# Patient Record
Sex: Male | Born: 1965 | Race: White | Hispanic: No | Marital: Single | Smoking: Never smoker
Health system: Southern US, Community
[De-identification: ages and names within clinical notes are randomized; demographics above are authoritative.]

## PROBLEM LIST (undated history)

## (undated) DIAGNOSIS — I10 Essential (primary) hypertension: Secondary | ICD-10-CM

## (undated) HISTORY — PX: HERNIA REPAIR: SHX51

---

## 1991-10-21 HISTORY — PX: ANTERIOR CRUCIATE LIGAMENT REPAIR: SHX115

## 2015-10-30 ENCOUNTER — Inpatient Hospital Stay (HOSPITAL_COMMUNITY)
Admission: EM | Admit: 2015-10-30 | Discharge: 2015-11-02 | DRG: 292 | Disposition: A | Discharge status: Home / Self Care | Payer: Self-pay | Attending: Student in an Organized Health Care Education/Training Program | Admitting: Student in an Organized Health Care Education/Training Program

## 2015-10-30 ENCOUNTER — Emergency Department (HOSPITAL_COMMUNITY): Payer: Self-pay

## 2015-10-30 ENCOUNTER — Inpatient Hospital Stay (HOSPITAL_COMMUNITY): Payer: MEDICAID

## 2015-10-30 ENCOUNTER — Encounter (HOSPITAL_COMMUNITY): Payer: Self-pay

## 2015-10-30 DIAGNOSIS — G621 Alcoholic polyneuropathy: Secondary | ICD-10-CM | POA: Diagnosis present

## 2015-10-30 DIAGNOSIS — F10239 Alcohol dependence with withdrawal, unspecified: Secondary | ICD-10-CM | POA: Diagnosis present

## 2015-10-30 DIAGNOSIS — R079 Chest pain, unspecified: Secondary | ICD-10-CM

## 2015-10-30 DIAGNOSIS — G4730 Sleep apnea, unspecified: Secondary | ICD-10-CM | POA: Diagnosis present

## 2015-10-30 DIAGNOSIS — J810 Acute pulmonary edema: Secondary | ICD-10-CM

## 2015-10-30 DIAGNOSIS — G629 Polyneuropathy, unspecified: Secondary | ICD-10-CM

## 2015-10-30 DIAGNOSIS — D6959 Other secondary thrombocytopenia: Secondary | ICD-10-CM | POA: Diagnosis present

## 2015-10-30 DIAGNOSIS — I5030 Unspecified diastolic (congestive) heart failure: Secondary | ICD-10-CM | POA: Insufficient documentation

## 2015-10-30 DIAGNOSIS — K7030 Alcoholic cirrhosis of liver without ascites: Secondary | ICD-10-CM | POA: Diagnosis present

## 2015-10-30 DIAGNOSIS — M25569 Pain in unspecified knee: Secondary | ICD-10-CM

## 2015-10-30 DIAGNOSIS — E669 Obesity, unspecified: Secondary | ICD-10-CM | POA: Diagnosis present

## 2015-10-30 DIAGNOSIS — F1020 Alcohol dependence, uncomplicated: Secondary | ICD-10-CM

## 2015-10-30 DIAGNOSIS — M170 Bilateral primary osteoarthritis of knee: Secondary | ICD-10-CM | POA: Diagnosis present

## 2015-10-30 DIAGNOSIS — R0789 Other chest pain: Secondary | ICD-10-CM

## 2015-10-30 DIAGNOSIS — E785 Hyperlipidemia, unspecified: Secondary | ICD-10-CM | POA: Diagnosis present

## 2015-10-30 DIAGNOSIS — D696 Thrombocytopenia, unspecified: Secondary | ICD-10-CM

## 2015-10-30 DIAGNOSIS — T502X5A Adverse effect of carbonic-anhydrase inhibitors, benzothiadiazides and other diuretics, initial encounter: Secondary | ICD-10-CM | POA: Diagnosis not present

## 2015-10-30 DIAGNOSIS — I110 Hypertensive heart disease with heart failure: Principal | ICD-10-CM | POA: Diagnosis present

## 2015-10-30 DIAGNOSIS — Z9114 Patient's other noncompliance with medication regimen: Secondary | ICD-10-CM

## 2015-10-30 DIAGNOSIS — I426 Alcoholic cardiomyopathy: Secondary | ICD-10-CM | POA: Diagnosis present

## 2015-10-30 DIAGNOSIS — E876 Hypokalemia: Secondary | ICD-10-CM | POA: Diagnosis not present

## 2015-10-30 DIAGNOSIS — Z6838 Body mass index (BMI) 38.0-38.9, adult: Secondary | ICD-10-CM

## 2015-10-30 DIAGNOSIS — I10 Essential (primary) hypertension: Secondary | ICD-10-CM

## 2015-10-30 DIAGNOSIS — Z825 Family history of asthma and other chronic lower respiratory diseases: Secondary | ICD-10-CM

## 2015-10-30 DIAGNOSIS — K760 Fatty (change of) liver, not elsewhere classified: Secondary | ICD-10-CM | POA: Diagnosis present

## 2015-10-30 DIAGNOSIS — Z77098 Contact with and (suspected) exposure to other hazardous, chiefly nonmedicinal, chemicals: Secondary | ICD-10-CM | POA: Diagnosis present

## 2015-10-30 DIAGNOSIS — I5031 Acute diastolic (congestive) heart failure: Secondary | ICD-10-CM | POA: Diagnosis present

## 2015-10-30 DIAGNOSIS — R0602 Shortness of breath: Secondary | ICD-10-CM

## 2015-10-30 DIAGNOSIS — Z820 Family history of epilepsy and other diseases of the nervous system: Secondary | ICD-10-CM

## 2015-10-30 HISTORY — DX: Essential (primary) hypertension: I10

## 2015-10-30 LAB — CBC WITH DIFFERENTIAL/PLATELET
Basophils Absolute: 0.1 10*3/uL (ref 0.0–0.1)
Basophils Relative: 1 %
EOS ABS: 0 10*3/uL (ref 0.0–0.7)
EOS PCT: 1 %
HCT: 38.6 % — ABNORMAL LOW (ref 39.0–52.0)
Hemoglobin: 13.2 g/dL (ref 13.0–17.0)
LYMPHS ABS: 1.7 10*3/uL (ref 0.7–4.0)
Lymphocytes Relative: 30 %
MCH: 31.4 pg (ref 26.0–34.0)
MCHC: 34.2 g/dL (ref 30.0–36.0)
MCV: 91.7 fL (ref 78.0–100.0)
MONO ABS: 0.7 10*3/uL (ref 0.1–1.0)
Monocytes Relative: 13 %
Neutro Abs: 3.2 10*3/uL (ref 1.7–7.7)
Neutrophils Relative %: 55 %
PLATELETS: 72 10*3/uL — AB (ref 150–400)
RBC: 4.21 MIL/uL — AB (ref 4.22–5.81)
RDW: 13.3 % (ref 11.5–15.5)
WBC: 5.7 10*3/uL (ref 4.0–10.5)

## 2015-10-30 LAB — COMPREHENSIVE METABOLIC PANEL
ALT: 30 U/L (ref 17–63)
AST: 99 U/L — ABNORMAL HIGH (ref 15–41)
Albumin: 3.1 g/dL — ABNORMAL LOW (ref 3.5–5.0)
Alkaline Phosphatase: 64 U/L (ref 38–126)
Anion gap: 11 (ref 5–15)
BUN: 8 mg/dL (ref 6–20)
CHLORIDE: 107 mmol/L (ref 101–111)
CO2: 24 mmol/L (ref 22–32)
CREATININE: 0.64 mg/dL (ref 0.61–1.24)
Calcium: 8.6 mg/dL — ABNORMAL LOW (ref 8.9–10.3)
Glucose, Bld: 103 mg/dL — ABNORMAL HIGH (ref 65–99)
POTASSIUM: 5.1 mmol/L (ref 3.5–5.1)
SODIUM: 142 mmol/L (ref 135–145)
Total Bilirubin: 1.5 mg/dL — ABNORMAL HIGH (ref 0.3–1.2)
Total Protein: 6.6 g/dL (ref 6.5–8.1)

## 2015-10-30 LAB — I-STAT TROPONIN, ED
TROPONIN I, POC: 0.03 ng/mL (ref 0.00–0.08)
Troponin i, poc: 0.02 ng/mL (ref 0.00–0.08)

## 2015-10-30 LAB — BRAIN NATRIURETIC PEPTIDE: B NATRIURETIC PEPTIDE 5: 58.9 pg/mL (ref 0.0–100.0)

## 2015-10-30 LAB — MAGNESIUM: Magnesium: 1.6 mg/dL — ABNORMAL LOW (ref 1.7–2.4)

## 2015-10-30 LAB — TROPONIN I: TROPONIN I: 0.04 ng/mL — AB (ref <0.031)

## 2015-10-30 LAB — MRSA PCR SCREENING: MRSA by PCR: NEGATIVE

## 2015-10-30 MED ORDER — LORAZEPAM 2 MG/ML IJ SOLN
0.5000 mg | Freq: Four times a day (QID) | INTRAMUSCULAR | Status: DC | PRN
  Administered 2015-10-31: 0.5 mg via INTRAVENOUS
  Filled 2015-10-30: qty 1

## 2015-10-30 MED ORDER — MORPHINE SULFATE (PF) 4 MG/ML IV SOLN
4.0000 mg | Freq: Once | INTRAVENOUS | Status: AC
  Administered 2015-10-30: 4 mg via INTRAVENOUS
  Filled 2015-10-30: qty 1

## 2015-10-30 MED ORDER — PANTOPRAZOLE SODIUM 40 MG PO TBEC
40.0000 mg | DELAYED_RELEASE_TABLET | Freq: Every day | ORAL | Status: DC
  Administered 2015-10-30 – 2015-11-02 (×4): 40 mg via ORAL
  Filled 2015-10-30 (×4): qty 1

## 2015-10-30 MED ORDER — VITAMIN B-1 100 MG PO TABS
100.0000 mg | ORAL_TABLET | Freq: Every day | ORAL | Status: DC
  Administered 2015-10-30 – 2015-11-02 (×4): 100 mg via ORAL
  Filled 2015-10-30 (×4): qty 1

## 2015-10-30 MED ORDER — LORAZEPAM 1 MG PO TABS: 0.0000 mg | ORAL_TABLET | Freq: Four times a day (QID) | ORAL | Status: DC

## 2015-10-30 MED ORDER — LORAZEPAM 2 MG/ML IJ SOLN
0.5000 mg | INTRAMUSCULAR | Status: AC
  Administered 2015-10-30: 0.5 mg via INTRAVENOUS

## 2015-10-30 MED ORDER — DICLOFENAC SODIUM 1 % TD GEL
2.0000 g | Freq: Four times a day (QID) | TRANSDERMAL | Status: DC
  Administered 2015-10-30 – 2015-11-01 (×8): 2 g via TOPICAL
  Filled 2015-10-30: qty 100

## 2015-10-30 MED ORDER — FOLIC ACID 1 MG PO TABS
1.0000 mg | ORAL_TABLET | Freq: Every day | ORAL | Status: DC
  Administered 2015-10-30 – 2015-11-02 (×4): 1 mg via ORAL
  Filled 2015-10-30 (×4): qty 1

## 2015-10-30 MED ORDER — NITROGLYCERIN 0.4 MG SL SUBL
0.4000 mg | SUBLINGUAL_TABLET | SUBLINGUAL | Status: DC | PRN
  Administered 2015-10-30 (×3): 0.4 mg via SUBLINGUAL
  Filled 2015-10-30: qty 1

## 2015-10-30 MED ORDER — PROMETHAZINE HCL 25 MG/ML IJ SOLN
6.2500 mg | Freq: Four times a day (QID) | INTRAMUSCULAR | Status: DC | PRN
  Administered 2015-10-31: 6.25 mg via INTRAVENOUS
  Filled 2015-10-30: qty 1

## 2015-10-30 MED ORDER — LORAZEPAM 2 MG/ML IJ SOLN
1.0000 mg | Freq: Once | INTRAMUSCULAR | Status: AC
  Administered 2015-10-30: 1 mg via INTRAVENOUS
  Filled 2015-10-30: qty 1

## 2015-10-30 MED ORDER — ADULT MULTIVITAMIN W/MINERALS CH
1.0000 | ORAL_TABLET | Freq: Every day | ORAL | Status: DC
  Administered 2015-10-30 – 2015-11-02 (×4): 1 via ORAL
  Filled 2015-10-30 (×4): qty 1

## 2015-10-30 MED ORDER — LORAZEPAM 2 MG/ML IJ SOLN: 0.0000 mg | Freq: Four times a day (QID) | INTRAMUSCULAR | Status: DC

## 2015-10-30 MED ORDER — PROMETHAZINE HCL 25 MG PO TABS
12.5000 mg | ORAL_TABLET | Freq: Four times a day (QID) | ORAL | Status: DC | PRN
  Administered 2015-10-30 – 2015-10-31 (×2): 12.5 mg via ORAL
  Filled 2015-10-30 (×2): qty 1

## 2015-10-30 MED ORDER — THIAMINE HCL 100 MG/ML IJ SOLN: 100.0000 mg | Freq: Every day | INTRAMUSCULAR | Status: DC

## 2015-10-30 MED ORDER — IPRATROPIUM-ALBUTEROL 0.5-2.5 (3) MG/3ML IN SOLN
3.0000 mL | Freq: Four times a day (QID) | RESPIRATORY_TRACT | Status: DC
  Administered 2015-10-30 – 2015-10-31 (×3): 3 mL via RESPIRATORY_TRACT
  Filled 2015-10-30 (×3): qty 3

## 2015-10-30 MED ORDER — IOHEXOL 350 MG/ML SOLN
80.0000 mL | Freq: Once | INTRAVENOUS | Status: AC | PRN
  Administered 2015-10-30: 80 mL via INTRAVENOUS

## 2015-10-30 MED ORDER — MORPHINE SULFATE (PF) 2 MG/ML IV SOLN
2.0000 mg | Freq: Four times a day (QID) | INTRAVENOUS | Status: DC | PRN
  Administered 2015-10-30 – 2015-10-31 (×3): 2 mg via INTRAVENOUS
  Filled 2015-10-30 (×4): qty 1

## 2015-10-30 MED ORDER — FUROSEMIDE 10 MG/ML IJ SOLN
60.0000 mg | Freq: Once | INTRAMUSCULAR | Status: AC
  Administered 2015-10-30: 60 mg via INTRAVENOUS
  Filled 2015-10-30: qty 6

## 2015-10-30 MED ORDER — IPRATROPIUM-ALBUTEROL 0.5-2.5 (3) MG/3ML IN SOLN
3.0000 mL | Freq: Once | RESPIRATORY_TRACT | Status: AC
  Administered 2015-10-30: 3 mL via RESPIRATORY_TRACT
  Filled 2015-10-30: qty 3

## 2015-10-30 MED ORDER — LORAZEPAM 1 MG PO TABS: 0.0000 mg | ORAL_TABLET | Freq: Two times a day (BID) | ORAL | Status: DC

## 2015-10-30 MED ORDER — LORAZEPAM 2 MG/ML IJ SOLN
0.0000 mg | Freq: Two times a day (BID) | INTRAMUSCULAR | Status: DC
Start: 2015-10-30 — End: 2015-10-30
  Filled 2015-10-30: qty 1

## 2015-10-30 MED ORDER — MAGNESIUM SULFATE 2 GM/50ML IV SOLN
2.0000 g | Freq: Once | INTRAVENOUS | Status: AC
  Administered 2015-10-30: 2 g via INTRAVENOUS
  Filled 2015-10-30: qty 50

## 2015-10-30 MED ORDER — LORAZEPAM 0.5 MG PO TABS: 0.5000 mg | ORAL_TABLET | Freq: Four times a day (QID) | ORAL | Status: DC | PRN

## 2015-10-30 NOTE — ED Notes (Signed)
Called lab - will add-on d-dimer.

## 2015-10-30 NOTE — ED Notes (Signed)
Pain after 3rd nitro 3/10

## 2015-10-30 NOTE — ED Notes (Signed)
Called lab after cancellation and reordering of d-dimer to confirm EDP does want d-dimer run.

## 2015-10-30 NOTE — H&P (Signed)
Date: 10/30/2015               Patient Name:  Alexander Velasquez MRN: [id number]  DOB: 05-21-1966 Age / Sex: 50 y.o., male   PCP: No primary care provider on file.         Medical Service: Internal Medicine Teaching Service         Attending Physician: Dr. Azalia Bilis, MD    First Contact: Dr. Gwynn Burly Pager: [phone]  Second Contact: Dr. Rich Number  Pager: [phone]       After Hours (After 5p/  First Contact Pager: [phone]  weekends / holidays): Second Contact Pager: [phone]   Chief Complaint: Chest pain and SOB  History of Present Illness: [name] is a 50 y.o. man with past medical history of hypertension and alcohol abuse who presented to the emergency department because he feels that he cannot breath or catch his breath and he "feels like an elephant is sitting on my chest".  Onset of symptoms began yesterday after painting a car in the garage while wearing a respirator when he became short of breath and developed this chest pressure.  He has no prior history of similar symptoms.  Today, patient was at work when similar symptoms recurred and he felt like he was going to pass out.  However, patient never did lose consciousness.  He currently describes the pain he has as intermittently sharp, left-sided, with constant sensation of pressure and being unable to adequately take a deep breath due to restriction.  His shortness of breath is present at rest and worsened with exertion.  His pain is currently 4/10 but was as bad as 8/10 or so prior to receiving morphine.  Patient has no significant cardiac or past medical history because he does not regularly follow with a PCP.  He carries a diagnosis of HTN for which he takes Lisinopril and a "fluid pill" that he cannot recall the name of.  He received these medications after a 1 time visit with [facility], but has not followed up since.  Patient also takes a aspirin 81mg  daily for primary prevention.  Patient has not taken  fluid pill for past 3 weeks due to running out of medication.  During this time, he has noticed increased lower extremity edema, abdominal swelling.  He has stable 3-pillow orthopnea that has not changed.  Patient also with significant alcohol use as reported by both himself and his fiance.  He drinks a case of beer daily (24 beers) and has been doing so "for many years".  His last drink was this morning.  He has never withdrawn or had seizures from alcohol withdrawal.  He has apparently spent the past 2 weeks at a friends house where they had been drinking more so than usual.  Patient denies any other drug use but does report taking ibuprofen 200mg  about 10 times per day.  When asked how long he has been doing so, patient replied, "forever".  On review of systems, patient reports lower extremity swelling, chronic numbness and tingling in his hands and feet that has worsened in last 2 weeks, bleeding from his mouth (states he wakes up with blood on his pillow), cough.  He denies any nausea, vomiting, diarrhea, constipation, melena, abdominal pain, hematemesis, GERD.  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  He has one living child who is healthy.  Other child passed away at young age due to complications from being paraplegic.  He is not a smoker, denies drug use, alcohol use described above.    Patient has no past medical history of DM, MI, HLD, heart failure.  Family history significant for atrial fibrillation in mom and brother, COPD in mom.  Meds: Current Facility-Administered Medications  Medication Dose Route Frequency Provider Last Rate Last Dose  . LORazepam (ATIVAN) injection 0-4 mg  0-4 mg Intravenous 4 times per day Tatyana Kirichenko, PA-C      . LORazepam (ATIVAN) injection 0-4 mg  0-4 mg Intravenous Q12H Tatyana  Kirichenko, PA-C   0 mg at 10/30/15 1547  . LORazepam (ATIVAN) injection 0.5 mg  0.5 mg Intravenous STAT Ejiroghene E Emokpae, MD      . LORazepam (ATIVAN) tablet 0-4 mg  0-4 mg Oral 4 times per day Tatyana Kirichenko, PA-C      . LORazepam (ATIVAN) tablet 0-4 mg  0-4 mg Oral Q12H Tatyana Kirichenko, PA-C   0 mg at 10/30/15 1548  . nitroGLYCERIN (NITROSTAT) SL tablet 0.4 mg  0.4 mg Sublingual Q5 min PRN Tatyana Kirichenko, PA-C   0.4 mg at 10/30/15 1246  . thiamine (B-1) injection 100 mg  100 mg Intravenous Daily Tatyana Kirichenko, PA-C   100 mg at 10/30/15 1804  . thiamine (VITAMIN B-1) tablet 100 mg  100 mg Oral Daily Tatyana Kirichenko, PA-C   100 mg at 10/30/15 1755   Current Outpatient Prescriptions  Medication Sig Dispense Refill  . ibuprofen (ADVIL,MOTRIN) 200 MG tablet Take 200 mg by mouth every 6 (six) hours as needed for fever.    Marland Kitchen lisinopril (PRINIVIL,ZESTRIL) 20 MG tablet Take 20 mg by mouth daily.    Marland Kitchen tetrahydrozoline-zinc (VISINE-AC) 0.05-0.25 % ophthalmic solution Place 2 drops into both eyes 3 (three) times daily as needed.      Allergies: Allergies as of 10/30/2015 - Review Complete 10/30/2015  Allergen Reaction Noted  . Codeine Shortness Of Breath 10/30/2015   Past Medical History  Diagnosis Date  . Hypertension    No past surgical history on file. No family history on file. Social History   Social History  . Marital Status: Single    Spouse Name: N/A  . Number of Children: N/A  . Years of Education: N/A   Occupational History  . Not on file.   Social History Main Topics  . Smoking status: Never Smoker   . Smokeless tobacco: Not on file  . Alcohol Use: 27.0 oz/week    45 Cans of beer per week  . Drug Use: No  . Sexual Activity: Not on file   Other Topics Concern  . Not on file   Social History Narrative  . No narrative on file    Review of Systems: Pertinent items noted in HPI and remainder of comprehensive ROS otherwise negative.  Physical  Exam: Blood pressure 132/88, pulse 106, temperature 98.4 F (36.9 C), temperature source Oral, resp. rate 23, height 5\' 7"  (1.702 m), weight 245 lb (111.131 kg), SpO2 95 %. Physical Exam  Constitutional:  Obese male, tremulous, diaphoretic, sitting up in bed, mildly distressed  HENT:  Head: Normocephalic and atraumatic.  Eyes: EOM are normal.  Neck: Normal range of motion. Neck supple. No JVD (difficult to assess due to habitus) present.  Cardiovascular: Normal rate, regular rhythm and normal heart sounds.   Peripheral pulses intact, lower  extremities cool to the touch (which patient states is normal).  Very tender to palpation on chest wall at left side where states his sharp pain is located.  Pulmonary/Chest:  Effort normal, no wheezes, minimal fine crackles in the bases.  Patient became tremulous when trying to perform exam.  Abdominal: Soft. Bowel sounds are normal. He exhibits no distension. There is no tenderness.  Musculoskeletal:  1+ pitting edema bilaterally extending up to the knee  Neurological:  Alert and oriented, tremulous, no asterixis  Skin: Skin is warm and dry.  intact    Lab results: Basic Metabolic Panel:  Recent Labs  40/98/1100/08/05 1153  NA 142  K 5.1  CL 107  CO2 24  GLUCOSE 103*  BUN 8  CREATININE 0.64  CALCIUM 8.6*   Liver Function Tests:  Recent Labs  10/30/15 1153  AST 99*  ALT 30  ALKPHOS 64  BILITOT 1.5*  PROT 6.6  ALBUMIN 3.1*   No results for input(s): LIPASE, AMYLASE in the last 72 hours. No results for input(s): AMMONIA in the last 72 hours. CBC:  Recent Labs  10/30/15 1153  WBC 5.7  NEUTROABS 3.2  HGB 13.2  HCT 38.6*  MCV 91.7  PLT 72*   Cardiac Enzymes: No results for input(s): CKTOTAL, CKMB, CKMBINDEX, TROPONINI in the last 72 hours. BNP: No results for input(s): PROBNP in the last 72 hours. D-Dimer: No results for input(s): DDIMER in the last 72 hours. CBG: No results for input(s): GLUCAP in the last 72  hours. Hemoglobin A1C: No results for input(s): HGBA1C in the last 72 hours. Fasting Lipid Panel: No results for input(s): CHOL, HDL, LDLCALC, TRIG, CHOLHDL, LDLDIRECT in the last 72 hours. Thyroid Function Tests: No results for input(s): TSH, T4TOTAL, FREET4, T3FREE, THYROIDAB in the last 72 hours. Anemia Panel: No results for input(s): VITAMINB12, FOLATE, FERRITIN, TIBC, IRON, RETICCTPCT in the last 72 hours. Coagulation: No results for input(s): LABPROT, INR in the last 72 hours. Urine Drug Screen: Drugs of Abuse  No results found for: LABOPIA, COCAINSCRNUR, LABBENZ, AMPHETMU, THCU, LABBARB  Alcohol Level: No results for input(s): ETH in the last 72 hours. Urinalysis: No results for input(s): COLORURINE, LABSPEC, PHURINE, GLUCOSEU, HGBUR, BILIRUBINUR, KETONESUR, PROTEINUR, UROBILINOGEN, NITRITE, LEUKOCYTESUR in the last 72 hours.  Invalid input(s): APPERANCEUR   Imaging results:  Ct Angio Chest Pe W/cm &/or Wo Cm  10/30/2015  CLINICAL DATA:  Mid chest pain, shortness of breath. EXAM: CT ANGIOGRAPHY CHEST WITH CONTRAST TECHNIQUE: Multidetector CT imaging of the chest was performed using the standard protocol during bolus administration of intravenous contrast. Multiplanar CT image reconstructions and MIPs were obtained to evaluate the vascular anatomy. CONTRAST:  180mL OMNIPAQUE IOHEXOL 350 MG/ML SOLN COMPARISON:  None. FINDINGS: The study was repeated due to suboptimal bolus on the initial imaging. Repeat imaging continues to remain somewhat suboptimal opacification of the pulmonary arterial tree, but no large, central or segmental pulmonary emboli noted. There is cardiomegaly. Mild vascular congestion noted with ground-glass opacities in the lingula and lower lobes bilaterally, possibly early edema. No pleural effusions. No mediastinal, hilar, or axillary adenopathy. Chest wall soft tissues are unremarkable. Imaging into the upper abdomen demonstrates nodular contours of the liver  compatible with cirrhosis. Diffusely low density suggesting fatty infiltration. No acute bony abnormality or focal bone lesion. Review of the MIP images confirms the above findings. IMPRESSION: Cardiomegaly. Ground-glass opacity in the lower lobes and lingula may reflect early edema. No visible pulmonary emboli. Despite repeating, evaluation of the peripheral vessels remain  suboptimal. Nodular, low-density liver compatible with cirrhosis and fatty infiltration. Electronically Signed   By: Charlett Nose M.D.   On: 10/30/2015 15:16   Dg Chest Portable 1 View  10/30/2015  CLINICAL DATA:  Left-sided chest pain for 2 days, initial encounter EXAM: PORTABLE CHEST - 1 VIEW COMPARISON:  None. FINDINGS: The heart size and mediastinal contours are within normal limits. Both lungs are clear. The visualized skeletal structures are unremarkable. IMPRESSION: No active disease. Electronically Signed   By: Alcide Clever M.D.   On: 10/30/2015 12:22    Other results: EKG: sinus tachycardia, prolonged QT  Assessment & Plan by Problem: Active Problems:   * No active hospital problems. *  50 y.o. man with past medical history of hypertension and alcohol abuse who presented to the emergency department because he feels that he cannot breath or catch his breath and he "feels like an elephant is sitting on my chest".   Chest Pain and Shortness of Breath: work-up in the ED included normal BNP, negative troponin x 2, portable CXR with no active disease, CT angio showed cardiomegaly and ground-glass opacity in the lower lobes and lingula that may reflect early edema.  There was no visible PE.  Patient received 60mg  Lasix IV in the ED with 2 liters output of urine, Duoneb treatment, morphine, and nitroglycerin.  Differential includes reactive upper airway dysfunction syndrome in setting of exposure to noxious stimuli, acute CHF, MSK injury given tenderness to palpation, PUD given extensive use of NSAIDs, ACS but consider this  unlikely given negative troponin thus far and reproducibility of chest pain, alcoholic cardiomyopathy given cardiomegaly on CT and excessive EtOH use.  His shortness of breath was improved on Duoneb treatment - admit to stepdown - cycle troponins - monitor on telemetry - check UDS - continue Duonebs q6h as patient had significant improvement with this - protonix 40mg  daily - echo - repeat EKG in morning - check 2-view CXR - check lipids - strict I/O's, daily weights - holding further Lasix pending echo tomorrow as normal BNP, stable orthopnea and thinking that his shortness of breath is more related to airway dysfunction  HTN: on lisinopril and "fluid pill" at home - hold home meds  Alcohol Abuse: CT angio showed nodular, low density liver compatible with cirrhosis and fatty infiltration.  Patient reports multiple year history of drinking 1 case of beer per day.  AST/ALT 99/30 - CSW consult - CIWA protocol - thiamine daily, folic acid daily, multivitamin daily - check magnesium  Peripheral Neuropathy - check A1C - check B12 - check HIV  Thrombocytopenia: CBC with platelet count of 72 confirmed by smear.  Causes could include chronic alcohol use, HIV, HCV, chronic liver disease.  Physical exam did not reveal any areas of ecchymosis or petechiae.  Patient does report waking up with blood on pillow that could reflect bleeding from his gums - repeat CBC - check HIV, HCV  FEN Fluids: none Electrolytes: replace as needed Nutrition: NPO  DVT PPx: SCDs  CODE: FULL  Dispo: Disposition is deferred at this time, awaiting improvement of current medical problems.    The patient does not have a current PCP (No primary care provider on file.) and does need an [facility] hospital follow-up appointment after discharge.  The patient does not have transportation limitations that hinder transportation to clinic appointments.  Signed: Gwynn Burly, DO 10/30/2015, 6:12 PM

## 2015-10-30 NOTE — ED Provider Notes (Signed)
CSN: [id number]     Arrival date & time 10/30/15  1136 History   First MD Initiated Contact with Patient 10/30/15 1137     Chief Complaint  Patient presents with  . Chest Pain     (Consider location/radiation/quality/duration/timing/severity/associated sxs/prior Treatment) HPI Alexander Velasquez is a 50 y.o. male with history of hypertension, presents to emergency department complaining of chest pain. Patient states that his symptoms started yesterday. 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 He states shortly after he felt like he wasn't feeling well. He developed some chest pressure, states had generalized weakness. 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 He states "I feel like something is sitting on my chest." He reports some shortness of breath. He states he has had increased swelling in bilateral lower legs over last 2 weeks, but states he ran out of his fluid pill. States he takes his fluid pills because "blood pressure medicines make me retain fluid." Patient currently does not have a primary care doctor. He denies any pain in his extremities. He states he has had some tingling in his toes and fingers. He received 224 mg of aspirin by EMS, 1 nitroglycerin, 6 mg of morphine, states pain went down from 8/10 to 6/10 in his chest. He denies any nausea or vomiting. No abdominal pain. No back pain. No headache. He is not a smoker, denies any cardiac history. States his cholesterol was high a few years ago and he is supposed to be taking some medications but he is not. He also states that he is a heavy drinker, states drinks multiple beers every day.  Past Medical History  Diagnosis Date  . Hypertension    No past surgical history on file. No family history on file. Social History  Substance Use Topics  .  Smoking status: Never Smoker   . Smokeless tobacco: Not on file  . Alcohol Use: 27.0 oz/week    45 Cans of beer per week    Review of Systems  Constitutional: Negative for fever and chills.  Respiratory: Positive for chest tightness and shortness of breath. Negative for cough.   Cardiovascular: Positive for chest pain and leg swelling. Negative for palpitations.  Gastrointestinal: Negative for nausea, vomiting, abdominal pain, diarrhea and abdominal distention.  Genitourinary: Negative for dysuria, frequency and hematuria.  Musculoskeletal: Negative for myalgias, arthralgias, neck pain and neck stiffness.  Skin: Negative for rash.  Allergic/Immunologic: Negative for immunocompromised state.  Neurological: Negative for dizziness, weakness, light-headedness, numbness and headaches.  All other systems reviewed and are negative.     Allergies  Codeine  Home Medications   Prior to Admission medications   Not on File   BP 138/98 mmHg  Pulse 104  Temp(Src) 98.4 F (36.9 C) (Oral)  Resp 20  Ht 5\' 7"  (1.702 m)  Wt 111.131 kg  BMI 38.36 kg/m2  SpO2 94% Physical Exam  Constitutional: He is oriented to person, place, and time. He appears well-developed and well-nourished. No distress.  HENT:  Head: Normocephalic and atraumatic.  Eyes: Conjunctivae are normal.  Neck: Neck supple.  Cardiovascular: Normal rate, regular rhythm and normal heart sounds.   Pulmonary/Chest: Effort normal and breath sounds normal. No respiratory distress. He has no wheezes. He has no rales.  Abdominal: Soft. Bowel sounds are  normal. He exhibits no distension. There is no tenderness. There is no rebound.  Musculoskeletal:  2+ pitting peripheral LE edema  Neurological: He is alert and oriented to person, place, and time.  Skin: Skin is warm and dry.  Nursing note and vitals reviewed.   ED Course  Procedures (including critical care time) Labs Review Labs Reviewed  CBC WITH DIFFERENTIAL/PLATELET -  Abnormal; Notable for the following:    RBC 4.21 (*)    HCT 38.6 (*)    Platelets 72 (*)    All other components within normal limits  COMPREHENSIVE METABOLIC PANEL - Abnormal; Notable for the following:    Glucose, Bld 103 (*)    Calcium 8.6 (*)    Albumin 3.1 (*)    AST 99 (*)    Total Bilirubin 1.5 (*)    All other components within normal limits  BRAIN NATRIURETIC PEPTIDE  I-STAT TROPOININ, ED  I-STAT TROPOININ, ED    Imaging Review Ct Angio Chest Pe W/cm &/or Wo Cm  10/30/2015  CLINICAL DATA:  Mid chest pain, shortness of breath. EXAM: CT ANGIOGRAPHY CHEST WITH CONTRAST TECHNIQUE: Multidetector CT imaging of the chest was performed using the standard protocol during bolus administration of intravenous contrast. Multiplanar CT image reconstructions and MIPs were obtained to evaluate the vascular anatomy. CONTRAST:  OMNIPAQUE IOHEXOL 350 MG/ML SOLN COMPARISON:  None. FINDINGS: The study was repeated due to suboptimal bolus on the initial imaging. Repeat imaging continues to remain somewhat suboptimal opacification of the pulmonary arterial tree, but no large, central or segmental pulmonary emboli noted. There is cardiomegaly. Mild vascular congestion noted with ground-glass opacities in the lingula and lower lobes bilaterally, possibly early edema. No pleural effusions. No mediastinal, hilar, or axillary adenopathy. Chest wall soft tissues are unremarkable. Imaging into the upper abdomen demonstrates nodular contours of the liver compatible with cirrhosis. Diffusely low density suggesting fatty infiltration. No acute bony abnormality or focal bone lesion. Review of the MIP images confirms the above findings. IMPRESSION: Cardiomegaly. Ground-glass opacity in the lower lobes and lingula may reflect early edema. No visible pulmonary emboli. Despite repeating, evaluation of the peripheral vessels remain suboptimal. Nodular, low-density liver compatible with cirrhosis and fatty infiltration.  Electronically Signed   By: Charlett Nose M.D.   On: 10/30/2015 15:16   Dg Chest Portable 1 View  10/30/2015  CLINICAL DATA:  Left-sided chest pain for 2 days, initial encounter EXAM: PORTABLE CHEST - 1 VIEW COMPARISON:  None. FINDINGS: The heart size and mediastinal contours are within normal limits. Both lungs are clear. The visualized skeletal structures are unremarkable. IMPRESSION: No active disease. Electronically Signed   By: Alcide Clever M.D.   On: 10/30/2015 12:22   I have personally reviewed and evaluated these images and lab results as part of my medical decision-making.   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 Symptoms worsened today again at work. Patient appears to be uncomfortable, flushed, short  of breath. He is complaining of severe chest tightness. He was given 6 mg of morphine and sublingual nitroglycerin by EMS. He was also given the 125 mg of aspirin. He states his pain is still 6/10. Will get labs, chest x-ray, will try a breathing treatment, more nitroglycerin and morphine as needed. His vital signs showed mild tachycardia, oxygen saturation in the low 90s on room air.   Patient dropped his oxygen saturation in the high 80s, placed on 2 L. His EKG shows no acute findings. Chest x-ray is negative. Labs including first set of troponin is negative. Will get CT angiogram to rule out PE. Discussed with Dr. Patria Maneampos who left the patient and agrees.  4:29 PM Patient started  to have shakes, will place on CIWA and Ativan. Patient was also given 60 mg of IV Lasix, he put out close to 2 L of urine. Continues to have chest tightness however. CT angiogram negative for PE, but shows possible early pulmonary edema. I discussed with teaching service they will admit patient for further treatment. Vital signs are normal at this time.  Jaynie Crumbleatyana Samie Barclift, PA-C 10/30/15 1632  Azalia BilisKevin Campos, MD 10/31/15 [phone]

## 2015-10-30 NOTE — Progress Notes (Signed)
MD notified of pt vomiting down in radiology. Pt had blood in his emesis. MD at bedside. Will continue to monitor the pt. Sanda LingerMilam, Airica Schwartzkopf R

## 2015-10-30 NOTE — ED Notes (Signed)
Patient undressed, in gown, on monitor, continuous pulse oximetry and blood pressure cuff 

## 2015-10-30 NOTE — ED Notes (Signed)
Dr,. At bedside

## 2015-10-30 NOTE — ED Notes (Signed)
Pt standing at end of bed. Peeing and voids into floor. Fiance at bedside when he gets out of bed. Pt very shakey on standing. Cautioned not to get out of bed.

## 2015-10-30 NOTE — Progress Notes (Signed)
MD notified of pt c/o of 5/10 cp as well as SOB. New orders given will continue to monitor the pt. Sanda LingerMilam, Lovena Kluck R, RN

## 2015-10-30 NOTE — ED Notes (Signed)
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 While at work today, began experiencing severe left-sided CP and felt "zoned out" (did not pass out). Given 324mg  aspirin, 1 nitro, 6mg  morphine. Pain has decreased since morphine. Still has chest pressure radiating to back. Increased swelling in legs and belly. Hyperventilating, flushed, bounding pulse upon EMS arrival. Pt drinks 6-8 beers per day.

## 2015-10-30 NOTE — ED Notes (Signed)
Pt transported to CT ?

## 2015-10-31 ENCOUNTER — Inpatient Hospital Stay (HOSPITAL_COMMUNITY): Payer: Self-pay

## 2015-10-31 ENCOUNTER — Inpatient Hospital Stay (HOSPITAL_COMMUNITY): Payer: MEDICAID

## 2015-10-31 DIAGNOSIS — I5030 Unspecified diastolic (congestive) heart failure: Secondary | ICD-10-CM | POA: Insufficient documentation

## 2015-10-31 LAB — BASIC METABOLIC PANEL
Anion gap: 11 (ref 5–15)
BUN: 6 mg/dL (ref 6–20)
CALCIUM: 8.6 mg/dL — AB (ref 8.9–10.3)
CO2: 28 mmol/L (ref 22–32)
Chloride: 102 mmol/L (ref 101–111)
Creatinine, Ser: 0.63 mg/dL (ref 0.61–1.24)
GFR calc Af Amer: >60 mL/min (ref >60)
Glucose, Bld: 98 mg/dL (ref 65–99)
POTASSIUM: 3.7 mmol/L (ref 3.5–5.1)
SODIUM: 141 mmol/L (ref 135–145)

## 2015-10-31 LAB — LIPID PANEL
CHOL/HDL RATIO: 3.2 ratio
CHOLESTEROL: 296 mg/dL — AB (ref 0–200)
HDL: 92 mg/dL (ref >40)
LDL Cholesterol: 182 mg/dL — ABNORMAL HIGH (ref 0–99)
TRIGLYCERIDES: 112 mg/dL (ref <150)
VLDL: 22 mg/dL (ref 0–40)

## 2015-10-31 LAB — RAPID URINE DRUG SCREEN, HOSP PERFORMED
AMPHETAMINES: NOT DETECTED
Barbiturates: NOT DETECTED
Benzodiazepines: NOT DETECTED
Cocaine: NOT DETECTED
OPIATES: POSITIVE — AB
Tetrahydrocannabinol: POSITIVE — AB

## 2015-10-31 LAB — HIV ANTIBODY (ROUTINE TESTING W REFLEX): HIV SCREEN 4TH GENERATION: NONREACTIVE

## 2015-10-31 LAB — TROPONIN I
Troponin I: 0.03 ng/mL (ref <0.031)
Troponin I: 0.03 ng/mL (ref <0.031)

## 2015-10-31 LAB — CBC
HCT: 38.5 % — ABNORMAL LOW (ref 39.0–52.0)
Hemoglobin: 12.9 g/dL — ABNORMAL LOW (ref 13.0–17.0)
MCH: 31 pg (ref 26.0–34.0)
MCHC: 33.5 g/dL (ref 30.0–36.0)
MCV: 92.5 fL (ref 78.0–100.0)
PLATELETS: 47 10*3/uL — AB (ref 150–400)
RBC: 4.16 MIL/uL — AB (ref 4.22–5.81)
RDW: 13.5 % (ref 11.5–15.5)
WBC: 3.9 10*3/uL — AB (ref 4.0–10.5)

## 2015-10-31 LAB — VITAMIN B12: Vitamin B-12: 673 pg/mL (ref 180–914)

## 2015-10-31 LAB — TSH: TSH: 1.899 u[IU]/mL (ref 0.350–4.500)

## 2015-10-31 MED ORDER — LORAZEPAM 2 MG/ML IJ SOLN
INTRAMUSCULAR | Status: AC
  Administered 2015-10-31: 1 mg via INTRAVENOUS
  Filled 2015-10-31: qty 1

## 2015-10-31 MED ORDER — LORAZEPAM 2 MG/ML IJ SOLN
1.0000 mg | Freq: Once | INTRAMUSCULAR | Status: AC
  Administered 2015-10-31: 1 mg via INTRAVENOUS

## 2015-10-31 MED ORDER — TRAMADOL HCL 50 MG PO TABS
50.0000 mg | ORAL_TABLET | Freq: Once | ORAL | Status: AC
  Administered 2015-10-31: 50 mg via ORAL
  Filled 2015-10-31: qty 1

## 2015-10-31 MED ORDER — LORAZEPAM 2 MG/ML IJ SOLN
2.0000 mg | INTRAMUSCULAR | Status: DC | PRN
  Administered 2015-10-31 – 2015-11-01 (×6): 2 mg via INTRAVENOUS
  Filled 2015-10-31 (×6): qty 1

## 2015-10-31 MED ORDER — GI COCKTAIL ~~LOC~~
30.0000 mL | Freq: Once | ORAL | Status: AC
  Administered 2015-10-31: 30 mL via ORAL
  Filled 2015-10-31: qty 30

## 2015-10-31 MED ORDER — LISINOPRIL 10 MG PO TABS
10.0000 mg | ORAL_TABLET | Freq: Every day | ORAL | Status: DC
  Administered 2015-10-31 – 2015-11-02 (×3): 10 mg via ORAL
  Filled 2015-10-31 (×3): qty 1

## 2015-10-31 MED ORDER — LORAZEPAM 1 MG PO TABS: 1.0000 mg | ORAL_TABLET | Freq: Four times a day (QID) | ORAL | Status: DC | PRN

## 2015-10-31 MED ORDER — IPRATROPIUM-ALBUTEROL 0.5-2.5 (3) MG/3ML IN SOLN: 3.0000 mL | Freq: Four times a day (QID) | RESPIRATORY_TRACT | Status: DC | PRN

## 2015-10-31 MED ORDER — LORAZEPAM 2 MG/ML IJ SOLN: 1.0000 mg | Freq: Four times a day (QID) | INTRAMUSCULAR | Status: DC | PRN

## 2015-10-31 MED ORDER — CHLORDIAZEPOXIDE HCL 25 MG PO CAPS
50.0000 mg | ORAL_CAPSULE | Freq: Three times a day (TID) | ORAL | Status: DC
  Administered 2015-10-31 – 2015-11-02 (×7): 50 mg via ORAL
  Filled 2015-10-31: qty 2
  Filled 2015-10-31: qty 10
  Filled 2015-10-31: qty 2
  Filled 2015-10-31: qty 10
  Filled 2015-10-31 (×4): qty 2
  Filled 2015-10-31: qty 10

## 2015-10-31 MED ORDER — FUROSEMIDE 10 MG/ML IJ SOLN
40.0000 mg | Freq: Once | INTRAMUSCULAR | Status: AC
  Administered 2015-10-31: 40 mg via INTRAVENOUS
  Filled 2015-10-31: qty 4

## 2015-10-31 MED ORDER — LORAZEPAM 1 MG PO TABS: 2.0000 mg | ORAL_TABLET | ORAL | Status: DC | PRN

## 2015-10-31 MED ORDER — FUROSEMIDE 40 MG PO TABS
40.0000 mg | ORAL_TABLET | Freq: Two times a day (BID) | ORAL | Status: DC
  Administered 2015-10-31 – 2015-11-02 (×4): 40 mg via ORAL
  Filled 2015-10-31 (×4): qty 1

## 2015-10-31 NOTE — Progress Notes (Signed)
Subjective: Patient seen and examined this morning on rounds.  Feeling better than yesterday but still having some shortness of breath as well as sharp, left-sided chest pain.  Objective: Vital signs in last 24 hours: Filed Vitals:   10/31/15 0400 10/31/15 0600 10/31/15 0737 10/31/15 0812  BP: 126/68 155/92  154/89  Pulse: 91   93  Temp: 98.4 F (36.9 C)   98.2 F (36.8 C)  TempSrc: Oral   Oral  Resp: 14   17  Height:      Weight: 246 lb 9.6 oz (111.857 kg)     SpO2: 94%  97% 97%   Weight change:   Intake/Output Summary (Last 24 hours) at 10/31/15 1103 Last data filed at 10/31/15 0823  Gross per 24 hour  Intake      0 ml  Output   2050 ml  Net  -2050 ml   General: resting in bed, no oxygen via [place] HEENT: EOMI, no scleral icterus Neck: JVD present Cardiac: RRR, no rubs, murmurs or gallops, left sided chest pain reproducible Pulm: no increased work, respirations non-labored Abd: obese, non-tender Ext: warm and well perfused, 1+ pitting edema Neuro: alert and oriented X3, no focal deficits  Lab Results: Basic Metabolic Panel:  Recent Labs Lab 10/30/15 1153 10/30/15 2205 10/31/15 0217  NA 142  --  141  K 5.1  --  3.7  CL 107  --  102  CO2 24  --  28  GLUCOSE 103*  --  98  BUN 8  --  6  CREATININE 0.64  --  0.63  CALCIUM 8.6*  --  8.6*  MG  --  1.6*  --    Liver Function Tests:  Recent Labs Lab 10/30/15 1153  AST 99*  ALT 30  ALKPHOS 64  BILITOT 1.5*  PROT 6.6  ALBUMIN 3.1*   No results for input(s): LIPASE, AMYLASE in the last 168 hours. No results for input(s): AMMONIA in the last 168 hours. CBC:  Recent Labs Lab 10/30/15 1153 10/31/15 0217  WBC 5.7 3.9*  NEUTROABS 3.2  --   HGB 13.2 12.9*  HCT 38.6* 38.5*  MCV 91.7 92.5  PLT 72* 47*   Cardiac Enzymes:  Recent Labs Lab 10/30/15 2205 10/31/15 0217 10/31/15 0844  TROPONINI 0.04* 0.03 0.03   BNP: No results for input(s): PROBNP in the last 168 hours. D-Dimer: No results for  input(s): DDIMER in the last 168 hours. CBG: No results for input(s): GLUCAP in the last 168 hours. Hemoglobin A1C: No results for input(s): HGBA1C in the last 168 hours. Fasting Lipid Panel:  Recent Labs Lab 10/31/15 0217  CHOL 296*  HDL 92  LDLCALC 182*  TRIG 112  CHOLHDL 3.2   Thyroid Function Tests: No results for input(s): TSH, T4TOTAL, FREET4, T3FREE, THYROIDAB in the last 168 hours. Coagulation: No results for input(s): LABPROT, INR in the last 168 hours. Anemia Panel:  Recent Labs Lab 10/30/15 2205  VITAMINB12 673   Urine Drug Screen: Drugs of Abuse  No results found for: LABOPIA, COCAINSCRNUR, LABBENZ, AMPHETMU, THCU, LABBARB  Alcohol Level: No results for input(s): ETH in the last 168 hours. Urinalysis: No results for input(s): COLORURINE, LABSPEC, PHURINE, GLUCOSEU, HGBUR, BILIRUBINUR, KETONESUR, PROTEINUR, UROBILINOGEN, NITRITE, LEUKOCYTESUR in the last 168 hours.  Invalid input(s): APPERANCEUR   Micro Results: Recent Results (from the past 240 hour(s))  MRSA PCR Screening     Status: None   Collection Time: 10/30/15  8:51 PM  Result Value Ref Range  Status   MRSA by PCR NEGATIVE NEGATIVE Final    Comment:        The GeneXpert MRSA Assay (FDA approved for NASAL specimens only), is one component of a comprehensive MRSA colonization surveillance program. It is not intended to diagnose MRSA infection nor to guide or monitor treatment for MRSA infections.    Studies/Results: Ct Angio Chest Pe W/cm &/or Wo Cm  10/30/2015  CLINICAL DATA:  Mid chest pain, shortness of breath. EXAM: CT ANGIOGRAPHY CHEST WITH CONTRAST TECHNIQUE: Multidetector CT imaging of the chest was performed using the standard protocol during bolus administration of intravenous contrast. Multiplanar CT image reconstructions and MIPs were obtained to evaluate the vascular anatomy. CONTRAST:  OMNIPAQUE IOHEXOL 350 MG/ML SOLN COMPARISON:  None. FINDINGS: The study was repeated due  to suboptimal bolus on the initial imaging. Repeat imaging continues to remain somewhat suboptimal opacification of the pulmonary arterial tree, but no large, central or segmental pulmonary emboli noted. There is cardiomegaly. Mild vascular congestion noted with ground-glass opacities in the lingula and lower lobes bilaterally, possibly early edema. No pleural effusions. No mediastinal, hilar, or axillary adenopathy. Chest wall soft tissues are unremarkable. Imaging into the upper abdomen demonstrates nodular contours of the liver compatible with cirrhosis. Diffusely low density suggesting fatty infiltration. No acute bony abnormality or focal bone lesion. Review of the MIP images confirms the above findings. IMPRESSION: Cardiomegaly. Ground-glass opacity in the lower lobes and lingula may reflect early edema. No visible pulmonary emboli. Despite repeating, evaluation of the peripheral vessels remain suboptimal. Nodular, low-density liver compatible with cirrhosis and fatty infiltration. Electronically Signed   By: Charlett Nose M.D.   On: 10/30/2015 15:16   Dg Chest Portable 1 View  10/30/2015  CLINICAL DATA:  Left-sided chest pain for 2 days, initial encounter EXAM: PORTABLE CHEST - 1 VIEW COMPARISON:  None. FINDINGS: The heart size and mediastinal contours are within normal limits. Both lungs are clear. The visualized skeletal structures are unremarkable. IMPRESSION: No active disease. Electronically Signed   By: Alcide Clever M.D.   On: 10/30/2015 12:22   Medications:  Scheduled Meds: . diclofenac sodium  2 g Topical QID  . folic acid  1 mg Oral Daily  . furosemide  40 mg Oral BID  . lisinopril  10 mg Oral Daily  . multivitamin with minerals  1 tablet Oral Daily  . pantoprazole  40 mg Oral Daily  . thiamine  100 mg Intravenous Daily  . thiamine  100 mg Oral Daily   Continuous Infusions:  PRN Meds:.ipratropium-albuterol, LORazepam **OR** LORazepam, morphine injection, nitroGLYCERIN, promethazine  **OR** promethazine Assessment/Plan: Active Problems:   Shortness of breath  Chest Pain: left sided chest pain easily reproduced with palpation seems atypical.  EKG without ischemic changes, troponin negative.  Do not suspect ACS.  Likely MSK or GI related given EtOH and NSAID use. - Cardiology has evaluated and do not feel inpatient work-up is needed unless Echo is abnormal - continue Voltaren gel for MSK pain - continue Protonix 40mg  daily - UDS pending  Shortness of Breath:  Could be from exposure to noxious stimuli causing acute shortness of breath that have been improved with breathing treatment.  However, patient does endorse SOB with exertion, inability to lie flat.  Mild fluid overload present on exam this morning.  CTA results did show cardiomegaly and possible early edema.  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 - Lasix IV 40mg  x 1 more - Echo pending - Duonebs as needed - repeat  CXR pending - weight today 246, down from 248 - net 2 L urine output last 24 hours  HTN: on lisinopril and "fluid pill" at home - start Lasix 40mg  BID - start Lisinopril 10mg  daily  HLD: TC 296, LDL 182, HDL 92 - current ASCVD 10-yr risk is 5.6%.  It would be reasonable to consider a moderate intensity statin in this patient.  Will await A1C results before initiating statin therapy  Alcohol Abuse: CT angio showed nodular, low density liver compatible with cirrhosis and fatty infiltration. Patient reports multiple year history of drinking 1 case of beer per day. 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    The patient does not have a current PCP (No primary care provider on file.) and [facility].  The patient does not have transportation limitations that hinder transportation to clinic appointments.  .Services Needed at time of discharge: Y = Yes, Blank = No PT:   OT:   RN:   Equipment:   Other:     LOS: 1 day   Gwynn BurlyAndrew Shanele Nissan, DO 10/31/2015, 11:03 AM

## 2015-10-31 NOTE — Progress Notes (Signed)
1859 patient tried to use urinal at bedside, bed alarm went off. Patients HR up to 150's. Pt HR now 120's resting. MD made aware. Will continue to monitor.   Last dose ativan 18:26. Rounded at 1915, patient seeing, "little kid in the room". (first hallucination) Patient also believed his family was standing outside the door.   When asked orientation questions, he is oriented times 4 at this time. MD paged and made aware.

## 2015-10-31 NOTE — Progress Notes (Signed)
CIWA 13 at beginning of shift, now CIWA 12. Patient tachycardic with tremors. Patient has severe tremors when standing, so keeping him on bedrest for now.  Urine is a dark [employer] color. MD paged, will continue to monitor.

## 2015-10-31 NOTE — Progress Notes (Signed)
Utilization review completed. Kore Madlock, RN, BSN. 

## 2015-10-31 NOTE — Progress Notes (Signed)
IMTS Night Team Progress Note  S: We assessed [name] ~10pm tonight to evaluate his continued chest pain and shortness of breath. He says overall, these are improving from when he came to the ED although still present. However, he did have one episode of clear emesis with scant speckles of red blood which hadn't occurred previously. This also made his non-radiating sharp left-sided chest pain worse transiently, but was improving as we saw him. He denies any other new symptoms, including abdominal pain, diarrhea, HA, vision changes, cough/hemoptysis.   O: BP 164/94, HR 95, RR 19, 93% RA Gen: Well-appearing, mildly diaphoretic, alert and oriented to person, place, and time CV: Normal rate, regular rhythm, no murmurs, rubs, or gallops. Left-sided chest pain reproducible on palpation. Pulmonary: Normal effort, CTA bilaterally, no wheezing, rales, or rhonchi Abdominal: Soft, non-tender, non-distended, without rebound, guarding, or masses Extremities: Distal pulses 2+ in upper and lower extremities bilaterally, no tenderness, erythema or edema Neuro: Tremulous at rest in upper extremities bilaterally. No other tremors noted. No focal deficits of strength or sensation.  A/P: Symptoms likely explained by ongoing sx of alcohol withdrawal as patient drinks 'at least a case' of beer/day (24 or more cans), drank 6 beers this AM before work, without h/o seizures but + h/o tremors if withdrawing. However, with new vomiting and worsened chest pain although no change in nature, we checked an EKG which was unchanged from previous EKGs without apparent ischemic changes. -Apply voltaren gel first -Morphine PRN if voltaren is not relieving pain -Phenergan PRN for nausea/vomiting -Continue CIWA

## 2015-10-31 NOTE — Consult Note (Signed)
CARDIOLOGY CONSULT NOTE   Patient ID: Alexander Velasquez MRN: [id number] DOB/AGE: 1965/11/13 50 y.o.  Admit date: 10/30/2015  Primary Physician   No primary care provider on file. Primary Cardiologist   New Reason for Consultation   Chest pain Requesting Physician Dr. Oswaldo Done  HPI: Alexander Velasquez is a 50 y.o. male with a history of hypertension and lower extremity edema for which he takes lisinopril and fluid pills (likely Lasix) probable presented to [facility] yesterday for evaluation of ongoing chest pain and shortness of breath for the past 2 days. He has not been taking his Lasix for the past 3 weeks as he ran out of it. Since then he has progressively developed a lower extremity edema and abdominal distention. He does not have a primary care provider. He sees one of MD at [place], [place] for his medication refill as needed. 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 No prior history of cardiac disease.  His symptoms began after painting car in the garage Saturday. He states that he wear but there was no ventilation. Since then patient has been having intermittent episode of left-sided chest pain described as a sharp and shortness of breath. His symptoms has been getting progressively worse. Yesterday 10/30/15 at work while painting patient developed a severe episode of left-sided sharp pain, shortness of breath and dizziness, "felt like going to pass out". No loss of consciousness. Shortness of breath and chest pain gets worse with minimal exertion, like turning her side in bed. She had a 1 episode of vomiting with tinge of blood yesterday.  Patient is a heavy alcohol drinker, drinking case of beer (at lest 24) daily for many years, recently drinking more. Patient denies any other drug use but does report taking ibuprofen 200mg  about 10 times per day. The patient also admits to having numbness and tingling in hands and feet.   MOM has COPD, AFib and Alzheimer disease. EKG shows  normal sinus rhythm with LVH. Point-of-care troponin 0.02. Troponin I 0.04-->0.03. LDL 182. Platelets 47.Chest  x-ray without acute heart or pulmonary disease, however reviewing symptoms like pulmonary edema on left side with cardiac enlargement. CT angiogram of the chest shows cardiomegaly with pulmonary edema, no visible pulmonary emboli (seems not adequate study) and cirrhosis and fatty infiltrate. Pending echo. Normal BNP.   He was on CIWA protocol and given Iv lasix 60mg  x 1. Continues to have shortness of breath and left-sided sharp chest pain which occasionally radiates to his back. Laying flat makes if worse.   Past Medical History  Diagnosis Date  . Hypertension      Past Surgical History  Procedure Laterality Date  . Anterior cruciate ligament repair  1993    rt knee  . Hernia repair      Allergies  Allergen Reactions  . Codeine Shortness Of Breath    I have reviewed the patient's current medications . diclofenac sodium  2 g Topical QID  . folic acid  1 mg Oral Daily  . multivitamin with minerals  1 tablet Oral Daily  . pantoprazole  40 mg Oral Daily  . thiamine  100 mg Intravenous Daily  . thiamine  100 mg Oral Daily     ipratropium-albuterol, LORazepam **OR** LORazepam, morphine injection, nitroGLYCERIN, promethazine **OR** promethazine  Prior to Admission medications   Medication Sig Start Date End Date Taking? Authorizing Provider  ibuprofen (ADVIL,MOTRIN) 200 MG tablet Take 200 mg by mouth every 6 (six) hours as needed for fever.  Yes Historical Provider, MD  lisinopril (PRINIVIL,ZESTRIL) 20 MG tablet Take 20 mg by mouth daily.   Yes Historical Provider, MD  tetrahydrozoline-zinc (VISINE-AC) 0.05-0.25 % ophthalmic solution Place 2 drops into both eyes 3 (three) times daily as needed.   Yes Historical Provider, MD     Social History   Social History  . Marital Status: Single    Spouse Name: N/A  . Number of Children: N/A  . Years of Education: N/A    Occupational History  . Not on file.   Social History Main Topics  . Smoking status: Never Smoker   . Smokeless tobacco: Not on file  . Alcohol Use: 100.8 oz/week    168 Cans of beer per week  . Drug Use: No  . Sexual Activity: Not on file   Other Topics Concern  . Not on file   Social History Narrative  . No narrative on file    Family hx  MOM has COPD, AFib and Alzheimer disease. No cardiac history of DAD.   ROS:  Full 14 point review of systems complete and found to be negative unless listed above.  Physical Exam: Blood pressure 154/89, pulse 93, temperature 98.2 F (36.8 C), temperature source Oral, resp. rate 17, height 5\' 7"  (1.702 m), weight 246 lb 9.6 oz (111.857 kg), SpO2 97 %.  General: Well developed, well nourished, obese male in no acute distress while looks uncomfortable with movement.  Head: Eyes PERRLA, No xanthomas. Normocephalic and atraumatic, oropharynx without edema or exudate.  Lungs: Resp regular and unlabored. Bibasilar rales L>R. L sided pain reproducible with palpation.  Heart: RRR no s3, s4, or murmurs..   Neck: No carotid bruits. No lymphadenopathy.  + JVD?  Abdomen: Bowel sounds present, abdomen tight and distended Msk:  No spine or cva tenderness. No weakness, no joint deformities or effusions. Extremities: No clubbing, cyanosis. Trace edema. DP/PT/Radials 2+ and equal bilaterally. Neuro: Alert and oriented X 3. No focal deficits noted. Psych:  Good affect, responds appropriately Skin: No rashes or lesions noted.  Labs:   Lab Results  Component Value Date   WBC 3.9* 10/31/2015   HGB 12.9* 10/31/2015   HCT 38.5* 10/31/2015   MCV 92.5 10/31/2015   PLT 47* 10/31/2015   No results for input(s): INR in the last 72 hours.  Recent Labs Lab 10/30/15 1153 10/31/15 0217  NA 142 141  K 5.1 3.7  CL 107 102  CO2 24 28  BUN 8 6  CREATININE 0.64 0.63  CALCIUM 8.6* 8.6*  PROT 6.6  --   BILITOT 1.5*  --   ALKPHOS 64  --   ALT 30  --    AST 99*  --   GLUCOSE 103* 98  ALBUMIN 3.1*  --    MAGNESIUM  Date Value Ref Range Status  10/30/2015 1.6* 1.7 - 2.4 mg/dL Final    Recent Labs  16/07/9600/10/17 2205 10/31/15 0217  TROPONINI 0.04* 0.03    Recent Labs  10/30/15 1213 10/30/15 1327  TROPIPOC 0.03 0.02   No results found for: PROBNP Lab Results  Component Value Date   CHOL 296* 10/31/2015   HDL 92 10/31/2015   LDLCALC 182* 10/31/2015   TRIG 112 10/31/2015   No results found for: DDIMER No results found for: LIPASE, AMYLASE No results found for: TSH, T4TOTAL, T3FREE, THYROIDAB VITAMIN B-12  Date/Time Value Ref Range Status  10/30/2015 10:05 PM 673 180 - 914 pg/mL Final    Comment:    (NOTE) This  assay is not validated for testing neonatal or myeloproliferative syndrome specimens for Vitamin B12 levels.     Echo: Pending  ECG:   Vent. rate 91 BPM PR interval 146 ms QRS duration 90 ms QT/QTc 406/499 ms P-R-T axes 47 -1 27  Radiology:  Ct Angio Chest Pe W/cm &/or Wo Cm  10/30/2015  CLINICAL DATA:  Mid chest pain, shortness of breath. EXAM: CT ANGIOGRAPHY CHEST WITH CONTRAST TECHNIQUE: Multidetector CT imaging of the chest was performed using the standard protocol during bolus administration of intravenous contrast. Multiplanar CT image reconstructions and MIPs were obtained to evaluate the vascular anatomy. CONTRAST:  OMNIPAQUE IOHEXOL 350 MG/ML SOLN COMPARISON:  None. FINDINGS: The study was repeated due to suboptimal bolus on the initial imaging. Repeat imaging continues to remain somewhat suboptimal opacification of the pulmonary arterial tree, but no large, central or segmental pulmonary emboli noted. There is cardiomegaly. Mild vascular congestion noted with ground-glass opacities in the lingula and lower lobes bilaterally, possibly early edema. No pleural effusions. No mediastinal, hilar, or axillary adenopathy. Chest wall soft tissues are unremarkable. Imaging into the upper abdomen demonstrates  nodular contours of the liver compatible with cirrhosis. Diffusely low density suggesting fatty infiltration. No acute bony abnormality or focal bone lesion. Review of the MIP images confirms the above findings. IMPRESSION: Cardiomegaly. Ground-glass opacity in the lower lobes and lingula may reflect early edema. No visible pulmonary emboli. Despite repeating, evaluation of the peripheral vessels remain suboptimal. Nodular, low-density liver compatible with cirrhosis and fatty infiltration. Electronically Signed   By: Charlett Nose M.D.   On: 10/30/2015 15:16   Dg Chest Portable 1 View  10/30/2015  CLINICAL DATA:  Left-sided chest pain for 2 days, initial encounter EXAM: PORTABLE CHEST - 1 VIEW COMPARISON:  None. FINDINGS: The heart size and mediastinal contours are within normal limits. Both lungs are clear. The visualized skeletal structures are unremarkable. IMPRESSION: No active disease. Electronically Signed   By: Alcide Clever M.D.   On: 10/30/2015 12:22    ASSESSMENT AND PLAN:      1. L sided chest pain - His pain is very atypical. Differential include MSK or GI etiology.  - EKG without acute abnormality. Trop 0.04-->0.03.  - No inpatient ischemic workup unless abnormal echo - Management per primary  2. 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 - CTA shows cardiomegaly with pulmonary edema as well exam. Given Iv lasix 60mg  x 1 in ED. Still has sign of mild volume overload. Will give IV lasix 40mg  x 1.  - Pending echo  3. HTN - BP is minimally elevated. Consider resuming home lisinopril.   4. Alcohol abuse/cirrhosis - per primary  5. Thrombocytopenia - Likely for chronic alcohol use. Per primary   Signed: Bhagat,Bhavinkumar, PA 10/31/2015, 8:22 AM Pager [phone]  Co-Sign MD  Agree with note by Chelsea Aus PA-C  Pt admitted with increasing SOB after running out of his diuretics 3 weeks ago. Admits to  dietary indiscretion (NA) as well as excessive ETOH (24 beers/d). BNP low. ENZ neg. EKG w/o acute changes. Improved with IV diuretics. Exam benign. Await 2D echo. 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 Suspect compliance will be an issue. Watch out for ETOH W/D.    Runell Gess, M.D., FACP, [facility], [facility] [address] [place], [place]  [address]  [phone] 10/31/2015 10:14 AM

## 2015-11-01 ENCOUNTER — Inpatient Hospital Stay (HOSPITAL_COMMUNITY): Payer: Self-pay

## 2015-11-01 DIAGNOSIS — I10 Essential (primary) hypertension: Secondary | ICD-10-CM | POA: Insufficient documentation

## 2015-11-01 DIAGNOSIS — J810 Acute pulmonary edema: Secondary | ICD-10-CM

## 2015-11-01 DIAGNOSIS — R079 Chest pain, unspecified: Secondary | ICD-10-CM

## 2015-11-01 LAB — CBC
HEMATOCRIT: 39.8 % (ref 39.0–52.0)
HEMOGLOBIN: 13.3 g/dL (ref 13.0–17.0)
MCH: 31.1 pg (ref 26.0–34.0)
MCHC: 33.4 g/dL (ref 30.0–36.0)
MCV: 93 fL (ref 78.0–100.0)
Platelets: 40 10*3/uL — ABNORMAL LOW (ref 150–400)
RBC: 4.28 MIL/uL (ref 4.22–5.81)
RDW: 13.2 % (ref 11.5–15.5)
WBC: 5 10*3/uL (ref 4.0–10.5)

## 2015-11-01 LAB — BASIC METABOLIC PANEL
ANION GAP: 9 (ref 5–15)
BUN: 13 mg/dL (ref 6–20)
CHLORIDE: 101 mmol/L (ref 101–111)
CO2: 28 mmol/L (ref 22–32)
Calcium: 8.6 mg/dL — ABNORMAL LOW (ref 8.9–10.3)
Creatinine, Ser: 0.7 mg/dL (ref 0.61–1.24)
GFR calc Af Amer: >60 mL/min (ref >60)
GFR calc non Af Amer: >60 mL/min (ref >60)
GLUCOSE: 100 mg/dL — AB (ref 65–99)
POTASSIUM: 3.3 mmol/L — AB (ref 3.5–5.1)
Sodium: 138 mmol/L (ref 135–145)

## 2015-11-01 LAB — HEPATITIS C ANTIBODY: HCV Ab: <0.1 s/co ratio (ref 0.0–0.9)

## 2015-11-01 LAB — HEMOGLOBIN A1C
HEMOGLOBIN A1C: 5.3 % (ref 4.8–5.6)
MEAN PLASMA GLUCOSE: 105 mg/dL

## 2015-11-01 MED ORDER — POTASSIUM CHLORIDE CRYS ER 20 MEQ PO TBCR
40.0000 meq | EXTENDED_RELEASE_TABLET | Freq: Once | ORAL | Status: AC
  Administered 2015-11-01: 40 meq via ORAL
  Filled 2015-11-01: qty 2

## 2015-11-01 NOTE — Progress Notes (Signed)
Medical Student Daily Progress Note  Subjective: Per nursing and night team, patient reported some hallucinations overnight. This morning, Alexander Velasquez reports that he is breathing much more comfortably  and is generally feeling better He denies any nausea, vomiting, hallucinations or tremors this morning.   Objective: Vital signs in last 24 hours: Filed Vitals:   10/31/15 1905 10/31/15 1906 10/31/15 2029 11/01/15 0000  BP:   143/94 151/99  Pulse:   114 106  Temp:   99 F (37.2 C) 98.5 F (36.9 C)  TempSrc:   Oral Oral  Resp: 20 20 14 20   Height:      Weight:      SpO2:   95% 96%    Intake/Output Summary (Last 24 hours) at 11/01/15 0741 Last data filed at 11/01/15 0600  Gross per 24 hour  Intake    360 ml  Output   1900 ml  Net  -1540 ml   Exam:  General: Lying comfortably in bed, in no acute distress  HEENT: EOMI, sclera clear Cardio: Regular rate and rhythm, no murmurs  Pulm: Lungs clear to auscultation bilaterally without wheezes or crackles. Breathing through pursed lips.  Abd: Soft, non-tender Ext: No lower extremity edema  Skin: Warm, dry Neuro: Alert and oriented x 3, no tremors  Psych: Appropriate behavior and affect  Lab Results: BMP notable for K+ of 3.3 WBC 5.0, Hgb 13.3., Hct 39.8, Plt 40 HA1c 5.3% TSH 1.899 UDS positive for opiates (received morphine in ED) and THC  Studies/Results:  Dg Knee Complete 4 Views Left on 10/31/2015 with no acute fracture or subluxation. Osteoarthritic changes including narrowing of the medial joint compartment, spurring and small joint effusion.   Dg Knee Complete 4 Views Right on 10/31/2015 with no acute fracture or subluxation. Postsurgical changs s/p ACL repair in distal femur and proximal tibia. Significant osteoarthritic changes including significant narrowing of the medial joint compartment, calcifications of medial collateral ligament, narrowing of the patellofemoral joint space and mild chondrocalcinosis.       Medications:  Scheduled Meds: . chlordiazePOXIDE  50 mg Oral TID  . diclofenac sodium  2 g Topical QID  . folic acid  1 mg Oral Daily  . furosemide  40 mg Oral BID  . lisinopril  10 mg Oral Daily  . multivitamin with minerals  1 tablet Oral Daily  . pantoprazole  40 mg Oral Daily  . potassium chloride  40 mEq Oral Once  . thiamine  100 mg Intravenous Daily  . thiamine  100 mg Oral Daily   PRN Meds:.ipratropium-albuterol, LORazepam **OR** LORazepam, morphine injection, nitroGLYCERIN, promethazine **OR** promethazine   Assessment/Plan: Active Problems:   Chest pain   Acute pulmonary edema   Alcohol abuse   Osteoarthritis    Cirrhosis   Mild hypokalemia  Mr. Pro is a 50 year-old man with a past medical history of hypertension and alcohol abuse who presented complaining of shortness of breath and chest pain/pressure. He is breathing more comfortably today and no longer appears volume overloaded on exam. From a withdrawal standpoint, he did have alcoholic hallucinosis overnight, but reports feeling better this morning. He will likely experience more severe withdrawal symptoms today, as he is now 52+ hours from his last drink. We plan for him to have an echocardiogram this morning and then will determine whether further workup will be inpatient or outpatient pending that result.   1. Chest Pain: Improved this morning. At this point, his pain is very unlikely ACS as he has had  a normal ECG and negative troponins. His ASCVD risk score is 5.7%, which is below the treatment threshold for coronary artery disease. However, CT angio was significant for cardiomegaly and mild pulmonary edema, and his history of orthopnea, lower extremity edema and dyspnea with exertion is consistent with heart failure. We plan for him to undergo echocardiography today. If the echo shows reduced ejection fraction, we will likely continue inpatient cardiac ischemic workup. Otherwise, he could undergo stress testing  as an outpatient. In the meantime, we will continue to treat him for gastroesophageal reflux as a contributor to his chest pain.  - Echo today; further cardiac ischemic workup pending these results  - Continue pantoprazole 40 mg once daily   2. Acute Pulmonary Edema: Improved. Mr. Rexene Albertsverhart continues to have some increased work of breathing on exam, but subjectively reports breathing much more comfortably and has an appropriate oxygen saturation on room air. No evidence of fluid overload on exam today. CTA results did show cardiomegaly and early pulmonary edema, which is likely a result of his hypertension and longstanding alcohol abuse. Further cardiac workup pending, as above.  - Duonebs Q6H - Continue Lasix 40 BID   3. Alcohol Abuse: Alcoholic hallucinosis overnight, doing well this morning. Likely will have more severe withdrawal symptoms today as he is now just over 48 hours since his last drink.  - Continue Librium 50 mg TID - CIWA protocol with PO Ativan 2mg  Q2H PRN for CIWA>8 - Continue thiamine, folic acid, multivitamin supplements - Social work consult for substance abuse resources after discharge - Consider Naloxone prescription upon discharge   4. Cirrhosis: Continued thrombocytopenia this morning. HIV and HCV screening negative.    - Will check Hep A antibody level and Hep B antibody levels today - Continue to monitor platelet count  - Will need outpatient follow up including EGD   5. Bilateral Osteoarthritis of the Knee: Patient has a history of osteoarthritis in both knees. No evidence of new fracture or subluxation on x-rays obtained yesterday.  - Continue Voltaren gel 2 g four times daily for pain   6. Mild Hypokalemia: Likely due to diuresis with Lasix. K+ 3.3 on morning BMP - Give 40 mEq PO potassium chloride   LOS: 52 days   Marylou FlesherKatherine Colston Pyle, MS3 [facility] [employer]  11/01/2015, 7:41 AM

## 2015-11-01 NOTE — Progress Notes (Signed)
Subjective: Patient seen and examined this morning.  Last night, patient did have some hallucinations per nursing and night team.  This morning, patient states feeling much better.  Currently denies any hallucinations, nausea, vomiting, or tremor.  States his breathing and chest pressure is much improved.  No other complaints.  He is urinating well and reports normal bowel movements.  Objective: Vital signs in last 24 hours: Filed Vitals:   10/31/15 1906 10/31/15 2029 11/01/15 0000 11/01/15 0819  BP:  143/94 151/99   Pulse:  114 106   Temp:  99 F (37.2 C) 98.5 F (36.9 C) 98.6 F (37 C)  TempSrc:  Oral Oral Oral  Resp: 20 14 20    Height:      Weight:      SpO2:  95% 96%    Weight change:   Intake/Output Summary (Last 24 hours) at 11/01/15 0845 Last data filed at 11/01/15 0819  Gross per 24 hour  Intake    600 ml  Output   1900 ml  Net  -1300 ml   General: sleeping in bed, easily aroused, no distress HEENT: EOMI, no scleral icterus Cardiac: RRR, no rubs, murmurs or gallops Pulm: no increased work, respirations non-labored, CTAB Abd: obese, non-tender Ext: warm and well perfused, trace pitting edema Neuro: alert and oriented, no focal deficits   Lab Results: Basic Metabolic Panel:  Recent Labs Lab 10/30/15 2205 10/31/15 0217 11/01/15 0408  NA  --  141 138  K  --  3.7 3.3*  CL  --  102 101  CO2  --  28 28  GLUCOSE  --  98 100*  BUN  --  6 13  CREATININE  --  0.63 0.70  CALCIUM  --  8.6* 8.6*  MG 1.6*  --   --    Liver Function Tests:  Recent Labs Lab 10/30/15 1153  AST 99*  ALT 30  ALKPHOS 64  BILITOT 1.5*  PROT 6.6  ALBUMIN 3.1*   No results for input(s): LIPASE, AMYLASE in the last 168 hours. No results for input(s): AMMONIA in the last 168 hours. CBC:  Recent Labs Lab 10/30/15 1153 10/31/15 0217 11/01/15 0408  WBC 5.7 3.9* 5.0  NEUTROABS 3.2  --   --   HGB 13.2 12.9* 13.3  HCT 38.6* 38.5* 39.8  MCV 91.7 92.5 93.0  PLT 72* 47* 40*     Cardiac Enzymes:  Recent Labs Lab 10/30/15 2205 10/31/15 0217 10/31/15 0844  TROPONINI 0.04* 0.03 0.03   BNP: No results for input(s): PROBNP in the last 168 hours. D-Dimer: No results for input(s): DDIMER in the last 168 hours. CBG: No results for input(s): GLUCAP in the last 168 hours. Hemoglobin A1C:  Recent Labs Lab 10/30/15 2205  HGBA1C 5.3   Fasting Lipid Panel:  Recent Labs Lab 10/31/15 0217  CHOL 296*  HDL 92  LDLCALC 182*  TRIG 112  CHOLHDL 3.2   Thyroid Function Tests:  Recent Labs Lab 10/31/15 1208  TSH 1.899   Coagulation: No results for input(s): LABPROT, INR in the last 168 hours. Anemia Panel:  Recent Labs Lab 10/30/15 2205  VITAMINB12 673   Urine Drug Screen: Drugs of Abuse     Component Value Date/Time   LABOPIA POSITIVE* 10/31/2015 1140   COCAINSCRNUR NONE DETECTED 10/31/2015 1140   LABBENZ NONE DETECTED 10/31/2015 1140   AMPHETMU NONE DETECTED 10/31/2015 1140   THCU POSITIVE* 10/31/2015 1140   LABBARB NONE DETECTED 10/31/2015 1140    Alcohol Level:  No results for input(s): ETH in the last 168 hours. Urinalysis: No results for input(s): COLORURINE, LABSPEC, PHURINE, GLUCOSEU, HGBUR, BILIRUBINUR, KETONESUR, PROTEINUR, UROBILINOGEN, NITRITE, LEUKOCYTESUR in the last 168 hours.  Invalid input(s): APPERANCEUR   Micro Results: Recent Results (from the past 240 hour(s))  MRSA PCR Screening     Status: None   Collection Time: 10/30/15  8:51 PM  Result Value Ref Range Status   MRSA by PCR NEGATIVE NEGATIVE Final    Comment:        The GeneXpert MRSA Assay (FDA approved for NASAL specimens only), is one component of a comprehensive MRSA colonization surveillance program. It is not intended to diagnose MRSA infection nor to guide or monitor treatment for MRSA infections.    Studies/Results: Dg Chest 2 View  10/31/2015  CLINICAL DATA:  Chest pain shortness of breath EXAM: CHEST  2 VIEW COMPARISON:  10/30/2015 FINDINGS:  Old lateral right rib fractures. Stable mild cardiac enlargement. Vascular pattern normal. No consolidation or effusion. IMPRESSION: No active cardiopulmonary disease. Electronically Signed   By: Esperanza Heir M.D.   On: 10/31/2015 12:56   Ct Angio Chest Pe W/cm &/or Wo Cm  10/30/2015  CLINICAL DATA:  Mid chest pain, shortness of breath. EXAM: CT ANGIOGRAPHY CHEST WITH CONTRAST TECHNIQUE: Multidetector CT imaging of the chest was performed using the standard protocol during bolus administration of intravenous contrast. Multiplanar CT image reconstructions and MIPs were obtained to evaluate the vascular anatomy. CONTRAST:  OMNIPAQUE IOHEXOL 350 MG/ML SOLN COMPARISON:  None. FINDINGS: The study was repeated due to suboptimal bolus on the initial imaging. Repeat imaging continues to remain somewhat suboptimal opacification of the pulmonary arterial tree, but no large, central or segmental pulmonary emboli noted. There is cardiomegaly. Mild vascular congestion noted with ground-glass opacities in the lingula and lower lobes bilaterally, possibly early edema. No pleural effusions. No mediastinal, hilar, or axillary adenopathy. Chest wall soft tissues are unremarkable. Imaging into the upper abdomen demonstrates nodular contours of the liver compatible with cirrhosis. Diffusely low density suggesting fatty infiltration. No acute bony abnormality or focal bone lesion. Review of the MIP images confirms the above findings. IMPRESSION: Cardiomegaly. Ground-glass opacity in the lower lobes and lingula may reflect early edema. No visible pulmonary emboli. Despite repeating, evaluation of the peripheral vessels remain suboptimal. Nodular, low-density liver compatible with cirrhosis and fatty infiltration. Electronically Signed   By: Charlett Nose M.D.   On: 10/30/2015 15:16   Dg Chest Portable 1 View  10/30/2015  CLINICAL DATA:  Left-sided chest pain for 2 days, initial encounter EXAM: PORTABLE CHEST - 1 VIEW  COMPARISON:  None. FINDINGS: The heart size and mediastinal contours are within normal limits. Both lungs are clear. The visualized skeletal structures are unremarkable. IMPRESSION: No active disease. Electronically Signed   By: Alcide Clever M.D.   On: 10/30/2015 12:22   Dg Knee Complete 4 Views Left  10/31/2015  CLINICAL DATA:  Bilateral knee pain EXAM: LEFT KNEE - COMPLETE 4+ VIEW COMPARISON:  None. FINDINGS: Four views of the left knee submitted. There is narrowing of medial joint compartment. Spurring of medial femoral condyle and medial tibial plateau. Narrowing of patellofemoral joint space. Small joint effusion. Mild spurring of patella. No acute fracture or subluxation. IMPRESSION: No acute fracture or subluxation. Osteoarthritic changes as described above. Electronically Signed   By: Natasha Mead M.D.   On: 10/31/2015 12:54   Dg Knee Complete 4 Views Right  10/31/2015  CLINICAL DATA:  Bilateral knee pain EXAM: RIGHT  KNEE - COMPLETE 4+ VIEW COMPARISON:  None. FINDINGS: Four views of the right knee submitted. Postsurgical changes post ACL repair are noted in distal femur and proximal tibia. There is significant narrowing of medial joint compartment. Calcifications of medial collateral ligament are noted. There is mild chondrocalcinosis. Mild sclerosis of medial tibial plateau. Trace joint effusion. Narrowing of patellofemoral joint space. Mild spurring of patella. No acute fracture or subluxation. IMPRESSION: No acute fracture or subluxation. Significant osteoarthritic changes as described above. Postsurgical changes post ACL repair. Electronically Signed   By: Natasha Mead M.D.   On: 10/31/2015 12:56   Medications:  Scheduled Meds: . chlordiazePOXIDE  50 mg Oral TID  . diclofenac sodium  2 g Topical QID  . folic acid  1 mg Oral Daily  . furosemide  40 mg Oral BID  . lisinopril  10 mg Oral Daily  . multivitamin with minerals  1 tablet Oral Daily  . pantoprazole  40 mg Oral Daily  . thiamine  100  mg Intravenous Daily  . thiamine  100 mg Oral Daily   Continuous Infusions:  PRN Meds:.ipratropium-albuterol, LORazepam **OR** LORazepam, morphine injection, nitroGLYCERIN, promethazine **OR** promethazine Assessment/Plan: Active Problems:   Shortness of breath   Acute pulmonary edema (HCC)   Pain in the chest  Chest Pain:  [name] is feeling better this morning and his chest pain is improved.  ACS unlikely with no ischemic changes on EKG and negative troponins. - Cardiology following, appreciate their recommendations - Continue Protonix for possible esophageal reflux contributing to chest pain and extensive NSAID use - Follow up Echo and plan of care determined by those results  Shortness of Breath Due To Volume Overload:  This is also improved as patient has diuresed almost 2 liters over the past 24 hours.  Seems likely at this point that shortness of breath was being caused by volume overload secondary to not taking his "fluid pill" for about 3 weeks.  On exam this AM, his lungs sound clear and he has trace lower extremity edema which is much better than when he was admitted to the hospital.  We are awaiting his Echo, if this shows a reduced EF, we may need to proceed with ischemic evaluation in the hospital.  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 - continue Lasix 40mg  PO BID - follow up Echo - repeat CXR yesterday showed stable, mild cardiac enlargement.  No consolidation or effusion noted.  HTN: on lisinopril and "fluid pill" at home - continue Lasix 40mg  BID - continue Lisinopril 10mg  daily  HLD: TC 296, LDL 182, HDL 92 - current ASCVD 10-yr risk is 5.6%.  It would be reasonable to consider a moderate intensity statin in this patient although not recommended.  His A1C was not indicative of having diabetes at 5.3.  Will hold off on starting a statin at this point pending further work-up.  Alcohol Abuse: CT angio showed nodular, low density liver  compatible with cirrhosis and fatty infiltration. Patient with alcoholic hallucinations last night but doing well this AM with no further hallucinations or tremors currently.  Started on scheduled Librium yesterday and increased his as needed Ativan to 2mg  based on CIWA scoring - Continue Librium 50mg  TID - Continue CIWA - CSW consulted at admission, appreciate their assistance - thiamine daily, folic acid daily, multivitamin daily  Cirrhosis: thrombocytopenia continued this morning, HIV and hepatitis C were negative - check hepatitis A and B serologies - monitor platelets - close outpatient  follow up  FEN Fluids: none Electrolytes: potassium 3.3 today, given K-dur.  Magnesium 1.6 on January 10, replaced Nutrition: heart healthy  DVT PPx: SCDs  CODE: FULL  Dispo: Disposition is deferred at this time, awaiting improvement of current medical problems.    The patient does not have a current PCP (No primary care provider on file.) and [facility].  The patient does not have transportation limitations that hinder transportation to clinic appointments.  .Services Needed at time of discharge: Y = Yes, Blank = No PT:   OT:   RN:   Equipment:   Other:     LOS: 2 days   Gwynn BurlyAndrew Briele Lagasse, DO 11/01/2015, 8:45 AM

## 2015-11-01 NOTE — Progress Notes (Signed)
Subjective:  Feeling much better with diuresis. No SOB  Objective:  Temp:  [98.5 F (36.9 C)-99 F (37.2 C)] 98.6 F (37 C) (01/12 0819) Pulse Rate:  [102-114] 106 (01/12 0000) Resp:  [14-28] 20 (01/12 0000) BP: (140-151)/(79-99) 151/99 mmHg (01/12 0000) SpO2:  [94 %-98 %] 96 % (01/12 0000) Weight change:   Intake/Output from previous day: 01/11 0701 - 01/12 0700 In: 360 [P.O.:360] Out: 1900 [Urine:1900]  Intake/Output from this shift: Total I/O In: 240 [P.O.:240] Out: -   Physical Exam: General appearance: alert and no distress Neck: no adenopathy, no carotid bruit, no JVD, supple, symmetrical, trachea midline and thyroid not enlarged, symmetric, no tenderness/mass/nodules Lungs: clear to auscultation bilaterally Heart: regular rate and rhythm, S1, S2 normal, no murmur, click, rub or gallop Extremities: extremities normal, atraumatic, no cyanosis or edema  Lab Results: Results for orders placed or performed during the hospital encounter of 10/30/15 (from the past 48 hour(s))  CBC with Differential/Platelet     Status: Abnormal   Collection Time: 10/30/15 11:53 AM  Result Value Ref Range   WBC 5.7 4.0 - 10.5 K/uL   RBC 4.21 (L) 4.22 - 5.81 MIL/uL   Hemoglobin 13.2 13.0 - 17.0 g/dL   HCT 38.6 (L) 39.0 - 52.0 %   MCV 91.7 78.0 - 100.0 fL   MCH 31.4 26.0 - 34.0 pg   MCHC 34.2 30.0 - 36.0 g/dL   RDW 13.3 11.5 - 15.5 %   Platelets 72 (L) 150 - 400 K/uL    Comment: PLATELET COUNT CONFIRMED BY SMEAR   Neutrophils Relative % 55 %   Neutro Abs 3.2 1.7 - 7.7 K/uL   Lymphocytes Relative 30 %   Lymphs Abs 1.7 0.7 - 4.0 K/uL   Monocytes Relative 13 %   Monocytes Absolute 0.7 0.1 - 1.0 K/uL   Eosinophils Relative 1 %   Eosinophils Absolute 0.0 0.0 - 0.7 K/uL   Basophils Relative 1 %   Basophils Absolute 0.1 0.0 - 0.1 K/uL  Comprehensive metabolic panel     Status: Abnormal   Collection Time: 10/30/15 11:53 AM  Result Value Ref Range   Sodium 142 135 - 145 mmol/L     Potassium 5.1 3.5 - 5.1 mmol/L    Comment: SPECIMEN HEMOLYZED. HEMOLYSIS MAY AFFECT INTEGRITY OF RESULTS.   Chloride 107 101 - 111 mmol/L   CO2 24 22 - 32 mmol/L   Glucose, Bld 103 (H) 65 - 99 mg/dL   BUN 8 6 - 20 mg/dL   Creatinine, Ser 0.64 0.61 - 1.24 mg/dL   Calcium 8.6 (L) 8.9 - 10.3 mg/dL   Total Protein 6.6 6.5 - 8.1 g/dL   Albumin 3.1 (L) 3.5 - 5.0 g/dL   AST 99 (H) 15 - 41 U/L   ALT 30 17 - 63 U/L   Alkaline Phosphatase 64 38 - 126 U/L   Total Bilirubin 1.5 (H) 0.3 - 1.2 mg/dL   GFR calc non Af Amer >60 >60 mL/min   GFR calc Af Amer >60 >60 mL/min    Comment: (NOTE) The eGFR has been calculated using the CKD EPI equation. This calculation has not been validated in all clinical situations. eGFR's persistently <60 mL/min signify possible Chronic Kidney Disease.    Anion gap 11 5 - 15  Brain natriuretic peptide     Status: None   Collection Time: 10/30/15 11:53 AM  Result Value Ref Range   B Natriuretic Peptide 58.9 0.0 - 100.0 pg/mL  I-stat troponin, ED     Status: None   Collection Time: 10/30/15 12:13 PM  Result Value Ref Range   Troponin i, poc 0.03 0.00 - 0.08 ng/mL   Comment 3            Comment: Due to the release kinetics of cTnI, a negative result within the first hours of the onset of symptoms does not rule out myocardial infarction with certainty. If myocardial infarction is still suspected, repeat the test at appropriate intervals.   I-Stat Troponin, ED (not at [facility])     Status: None   Collection Time: 10/30/15  1:27 PM  Result Value Ref Range   Troponin i, poc 0.02 0.00 - 0.08 ng/mL   Comment 3            Comment: Due to the release kinetics of cTnI, a negative result within the first hours of the onset of symptoms does not rule out myocardial infarction with certainty. If myocardial infarction is still suspected, repeat the test at appropriate intervals.   MRSA PCR Screening     Status: None   Collection Time: 10/30/15  8:51 PM  Result Value  Ref Range   MRSA by PCR NEGATIVE NEGATIVE    Comment:        The GeneXpert MRSA Assay (FDA approved for NASAL specimens only), is one component of a comprehensive MRSA colonization surveillance program. It is not intended to diagnose MRSA infection nor to guide or monitor treatment for MRSA infections.   HIV antibody     Status: None   Collection Time: 10/30/15 10:05 PM  Result Value Ref Range   HIV Screen 4th Generation wRfx Non Reactive Non Reactive    Comment: (NOTE) Performed At: [facility] [place], [place] [id number] Lindon Romp MD [id number]   Hepatitis C antibody     Status: None   Collection Time: 10/30/15 10:05 PM  Result Value Ref Range   HCV Ab <0.1 0.0 - 0.9 s/co ratio    Comment: (NOTE)                                  Negative:     < 0.8                             Indeterminate: 0.8 - 0.9                                  Positive:     > 0.9 The CDC recommends that a positive HCV antibody result be followed up with a HCV Nucleic Acid Amplification test ([id number]). Performed At: [facility] [place], [place] [id number] Lindon Romp MD [id number]   Vitamin B12     Status: None   Collection Time: 10/30/15 10:05 PM  Result Value Ref Range   Vitamin B-12 673 180 - 914 pg/mL    Comment: (NOTE) This assay is not validated for testing neonatal or myeloproliferative syndrome specimens for Vitamin B12 levels.   Hemoglobin A1c     Status: None   Collection Time: 10/30/15 10:05 PM  Result Value Ref Range   Hgb A1c MFr Bld 5.3 4.8 - 5.6 %    Comment: (NOTE)         Pre-diabetes: 5.7 - 6.4  Diabetes: >6.4         Glycemic control for adults with diabetes: <7.0    Mean Plasma Glucose 105 mg/dL    Comment: (NOTE) Performed At: [facility] [place], [place] [id number] Lindon Romp MD [id number]   Troponin I     Status: Abnormal   Collection Time: 10/30/15 10:05  PM  Result Value Ref Range   Troponin I 0.04 (H) <0.031 ng/mL    Comment:        PERSISTENTLY INCREASED TROPONIN VALUES IN THE RANGE OF 0.04-0.49 ng/mL CAN BE SEEN IN:       -UNSTABLE ANGINA       -CONGESTIVE HEART FAILURE       -MYOCARDITIS       -CHEST TRAUMA       -ARRYHTHMIAS       -LATE PRESENTING MYOCARDIAL INFARCTION       -COPD   CLINICAL FOLLOW-UP RECOMMENDED.   Magnesium     Status: Abnormal   Collection Time: 10/30/15 10:05 PM  Result Value Ref Range   Magnesium 1.6 (L) 1.7 - 2.4 mg/dL  CBC     Status: Abnormal   Collection Time: 10/31/15  2:17 AM  Result Value Ref Range   WBC 3.9 (L) 4.0 - 10.5 K/uL   RBC 4.16 (L) 4.22 - 5.81 MIL/uL   Hemoglobin 12.9 (L) 13.0 - 17.0 g/dL   HCT 38.5 (L) 39.0 - 52.0 %   MCV 92.5 78.0 - 100.0 fL   MCH 31.0 26.0 - 34.0 pg   MCHC 33.5 30.0 - 36.0 g/dL   RDW 13.5 11.5 - 15.5 %   Platelets 47 (L) 150 - 400 K/uL    Comment: CONSISTENT WITH PREVIOUS RESULT  Basic metabolic panel     Status: Abnormal   Collection Time: 10/31/15  2:17 AM  Result Value Ref Range   Sodium 141 135 - 145 mmol/L   Potassium 3.7 3.5 - 5.1 mmol/L    Comment: DELTA CHECK NOTED   Chloride 102 101 - 111 mmol/L   CO2 28 22 - 32 mmol/L   Glucose, Bld 98 65 - 99 mg/dL   BUN 6 6 - 20 mg/dL   Creatinine, Ser 0.63 0.61 - 1.24 mg/dL   Calcium 8.6 (L) 8.9 - 10.3 mg/dL   GFR calc non Af Amer >60 >60 mL/min   GFR calc Af Amer >60 >60 mL/min    Comment: (NOTE) The eGFR has been calculated using the CKD EPI equation. This calculation has not been validated in all clinical situations. eGFR's persistently <60 mL/min signify possible Chronic Kidney Disease.    Anion gap 11 5 - 15  Troponin I     Status: None   Collection Time: 10/31/15  2:17 AM  Result Value Ref Range   Troponin I 0.03 <0.031 ng/mL    Comment:        NO INDICATION OF MYOCARDIAL INJURY.   Lipid panel     Status: Abnormal   Collection Time: 10/31/15  2:17 AM  Result Value Ref Range    Cholesterol 296 (H) 0 - 200 mg/dL   Triglycerides 112 <150 mg/dL   HDL 92 >40 mg/dL   Total CHOL/HDL Ratio 3.2 RATIO   VLDL 22 0 - 40 mg/dL   LDL Cholesterol 182 (H) 0 - 99 mg/dL    Comment:        Total Cholesterol/HDL:CHD Risk Coronary Heart Disease Risk Table  Men   Women  1/2 Average Risk   3.4   3.3  Average Risk       5.0   4.4  2 X Average Risk   9.6   7.1  3 X Average Risk  23.4   11.0        Use the calculated Patient Ratio above and the CHD Risk Table to determine the patient's CHD Risk.        ATP III CLASSIFICATION (LDL):  <100     mg/dL   Optimal  100-129  mg/dL   Near or Above                    Optimal  130-159  mg/dL   Borderline  160-189  mg/dL   High  >190     mg/dL   Very High   Troponin I     Status: None   Collection Time: 10/31/15  8:44 AM  Result Value Ref Range   Troponin I 0.03 <0.031 ng/mL    Comment:        NO INDICATION OF MYOCARDIAL INJURY.   Urine rapid drug screen (hosp performed)     Status: Abnormal   Collection Time: 10/31/15 11:40 AM  Result Value Ref Range   Opiates POSITIVE (A) NONE DETECTED   Cocaine NONE DETECTED NONE DETECTED   Benzodiazepines NONE DETECTED NONE DETECTED   Amphetamines NONE DETECTED NONE DETECTED   Tetrahydrocannabinol POSITIVE (A) NONE DETECTED   Barbiturates NONE DETECTED NONE DETECTED    Comment:        DRUG SCREEN FOR MEDICAL PURPOSES ONLY.  IF CONFIRMATION IS NEEDED FOR ANY PURPOSE, NOTIFY LAB WITHIN 5 DAYS.        LOWEST DETECTABLE LIMITS FOR URINE DRUG SCREEN Drug Class       Cutoff (ng/mL) Amphetamine      1000 Barbiturate      200 Benzodiazepine   861 Tricyclics       683 Opiates          300 Cocaine          300 THC              50   TSH     Status: None   Collection Time: 10/31/15 12:08 PM  Result Value Ref Range   TSH 1.899 0.350 - 4.500 uIU/mL  CBC     Status: Abnormal   Collection Time: 11/01/15  4:08 AM  Result Value Ref Range   WBC 5.0 4.0 - 10.5 K/uL   RBC  4.28 4.22 - 5.81 MIL/uL   Hemoglobin 13.3 13.0 - 17.0 g/dL   HCT 39.8 39.0 - 52.0 %   MCV 93.0 78.0 - 100.0 fL   MCH 31.1 26.0 - 34.0 pg   MCHC 33.4 30.0 - 36.0 g/dL   RDW 13.2 11.5 - 15.5 %   Platelets 40 (L) 150 - 400 K/uL    Comment: CONSISTENT WITH PREVIOUS RESULT  Basic metabolic panel     Status: Abnormal   Collection Time: 11/01/15  4:08 AM  Result Value Ref Range   Sodium 138 135 - 145 mmol/L   Potassium 3.3 (L) 3.5 - 5.1 mmol/L   Chloride 101 101 - 111 mmol/L   CO2 28 22 - 32 mmol/L   Glucose, Bld 100 (H) 65 - 99 mg/dL   BUN 13 6 - 20 mg/dL   Creatinine, Ser 0.70 0.61 - 1.24 mg/dL   Calcium 8.6 (L) 8.9 -  10.3 mg/dL   GFR calc non Af Amer >60 >60 mL/min   GFR calc Af Amer >60 >60 mL/min    Comment: (NOTE) The eGFR has been calculated using the CKD EPI equation. This calculation has not been validated in all clinical situations. eGFR's persistently <60 mL/min signify possible Chronic Kidney Disease.    Anion gap 9 5 - 15    Imaging: Imaging results have been reviewed  Tele- SR/ST  Assessment/Plan:   1. Active Problems: 2.   Shortness of breath 3.   Acute pulmonary edema (HCC) 4.   Pain in the chest 5.   Time Spent Directly with Patient:  20 minutes  Length of Stay:  LOS: 2 days   Feeling better with diuresis . I/O neg 3.5 liters. Labs remarkable for decreased K & Mg---->> needs repletion. Diuretic and ACE-I added. 2D pending to assess LV Fxn.  Will continue to follow.  Quay Burow 11/01/2015, 10:37 AM

## 2015-11-01 NOTE — Progress Notes (Signed)
  Echocardiogram 2D Echocardiogram has been performed.  Cathie BeamsGREGORY, Samina Weekes 11/01/2015, 3:41 PM

## 2015-11-02 ENCOUNTER — Telehealth: Payer: Self-pay | Admitting: Cardiovascular Disease

## 2015-11-02 DIAGNOSIS — M25562 Pain in left knee: Secondary | ICD-10-CM

## 2015-11-02 DIAGNOSIS — K7030 Alcoholic cirrhosis of liver without ascites: Secondary | ICD-10-CM

## 2015-11-02 DIAGNOSIS — E785 Hyperlipidemia, unspecified: Secondary | ICD-10-CM

## 2015-11-02 DIAGNOSIS — I110 Hypertensive heart disease with heart failure: Principal | ICD-10-CM

## 2015-11-02 DIAGNOSIS — E8770 Fluid overload, unspecified: Secondary | ICD-10-CM

## 2015-11-02 DIAGNOSIS — I5031 Acute diastolic (congestive) heart failure: Secondary | ICD-10-CM

## 2015-11-02 DIAGNOSIS — M25561 Pain in right knee: Secondary | ICD-10-CM

## 2015-11-02 LAB — BASIC METABOLIC PANEL
ANION GAP: 8 (ref 5–15)
BUN: 14 mg/dL (ref 6–20)
CALCIUM: 8.8 mg/dL — AB (ref 8.9–10.3)
CO2: 27 mmol/L (ref 22–32)
CREATININE: 0.86 mg/dL (ref 0.61–1.24)
Chloride: 102 mmol/L (ref 101–111)
Glucose, Bld: 98 mg/dL (ref 65–99)
Potassium: 3.4 mmol/L — ABNORMAL LOW (ref 3.5–5.1)
SODIUM: 137 mmol/L (ref 135–145)

## 2015-11-02 LAB — CBC
HCT: 39.1 % (ref 39.0–52.0)
Hemoglobin: 13.9 g/dL (ref 13.0–17.0)
MCH: 32.3 pg (ref 26.0–34.0)
MCHC: 35.5 g/dL (ref 30.0–36.0)
MCV: 90.7 fL (ref 78.0–100.0)
PLATELETS: 69 10*3/uL — AB (ref 150–400)
RBC: 4.31 MIL/uL (ref 4.22–5.81)
RDW: 13.2 % (ref 11.5–15.5)
WBC: 7.3 10*3/uL (ref 4.0–10.5)

## 2015-11-02 MED ORDER — LISINOPRIL 10 MG PO TABS: 10.0000 mg | ORAL_TABLET | Freq: Every day | ORAL | Status: AC

## 2015-11-02 MED ORDER — ADULT MULTIVITAMIN W/MINERALS CH: 1.0000 | ORAL_TABLET | Freq: Every day | ORAL | Status: AC

## 2015-11-02 MED ORDER — DICLOFENAC SODIUM 1 % TD GEL: TRANSDERMAL | Status: AC

## 2015-11-02 MED ORDER — FUROSEMIDE 40 MG PO TABS: 40.0000 mg | ORAL_TABLET | Freq: Every day | ORAL | Status: DC

## 2015-11-02 MED ORDER — METOPROLOL TARTRATE 25 MG PO TABS
25.0000 mg | ORAL_TABLET | Freq: Two times a day (BID) | ORAL | Status: DC
  Administered 2015-11-02: 25 mg via ORAL
  Filled 2015-11-02: qty 1

## 2015-11-02 MED ORDER — THIAMINE HCL 100 MG PO TABS: 100.0000 mg | ORAL_TABLET | Freq: Every day | ORAL | Status: AC

## 2015-11-02 MED ORDER — FOLIC ACID 1 MG PO TABS: 1.0000 mg | ORAL_TABLET | Freq: Every day | ORAL | Status: AC

## 2015-11-02 MED ORDER — NALTREXONE HCL 50 MG PO TABS: 50.0000 mg | ORAL_TABLET | Freq: Every day | ORAL | Status: AC

## 2015-11-02 MED ORDER — POTASSIUM CHLORIDE CRYS ER 20 MEQ PO TBCR
40.0000 meq | EXTENDED_RELEASE_TABLET | Freq: Once | ORAL | Status: AC
  Administered 2015-11-02: 40 meq via ORAL
  Filled 2015-11-02: qty 2

## 2015-11-02 MED ORDER — METOPROLOL TARTRATE 25 MG PO TABS: 25.0000 mg | ORAL_TABLET | Freq: Two times a day (BID) | ORAL | Status: DC

## 2015-11-02 MED ORDER — CHLORDIAZEPOXIDE HCL 25 MG PO CAPS: ORAL_CAPSULE | ORAL | Status: DC

## 2015-11-02 NOTE — Progress Notes (Signed)
Subjective: No acute events overnight. Alexander Velasquez reports that he is feeling well this morning and has not had tremors or hallucinations overnight. He continues to breathe comfortably. He is eager to return home today.   Objective: Vital signs in last 24 hours: Filed Vitals:   11/02/15 0439 11/02/15 0735 11/02/15 1147 11/02/15 1341  BP: 170/95 154/94 136/80   Pulse: 104 102 101 119  Temp:  98.9 F (37.2 C) 98.6 F (37 C)   TempSrc:  Oral Oral   Resp: 21 21 18    Height:      Weight:      SpO2: 91% 93% 95% 95%   Intake/Output Summary (Last 24 hours) at 11/02/15 1432 Last data filed at 11/02/15 1257  Gross per 24 hour  Intake    480 ml  Output    300 ml  Net    180 ml    Exam: General: well-appearing, lying in bed, minimally diaphoretic HEENT: EOMI, sclera clear  Pulm/Chest: no increased work of breathing  Abd: non-distended  MSK: no lower extremity edema  Neuro: alert and oriented x 3 Psych: appropriate behavior and affect   Lab Results: CBC notable for platelets of 69 BMP notable for potassium 3.4, calcium 8.8  Studies/Results: Echo Complete w/o Image Enhancing Agent on 11/01/11: Left ventricle normal in size with mild concentric hypertrophy. Normal systolic function with EF 55-60%. Grade I diastolic dysfunction. Left atrium mildly dilated. Mitral valve with mild calcification. Right atrium and ventricle normal in size and with normal systolic function.   Medications: Scheduled Meds: . chlordiazePOXIDE  50 mg Oral TID  . diclofenac sodium  2 g Topical QID  . folic acid  1 mg Oral Daily  . furosemide  40 mg Oral BID  . lisinopril  10 mg Oral Daily  . metoprolol tartrate  25 mg Oral BID  . multivitamin with minerals  1 tablet Oral Daily  . pantoprazole  40 mg Oral Daily  . thiamine  100 mg Intravenous Daily  . thiamine  100 mg Oral Daily   PRN Meds:.ipratropium-albuterol, LORazepam **OR** LORazepam, morphine injection, nitroGLYCERIN, promethazine **OR**  promethazine   Assessment/Plan: Alexander Velasquez is a 50 year-old male with history of hypertension and alcohol abuse who presented for chest pressure and shortness of breath. He continues to be comfortable despite alcohol withdrawal and reports feeling well and breathing at baseline today. At this point, we plan to discharge him to complete the Librium taper and follow up with cardiology and GI as an outpatient.   1. Volume overload: Likely due to heart failure with preserved ejection fraction, given yesterday's echo results. Cirrhosis may also be contributing. He shows no signs of volume overload on exam today and is net negative 3.7 L since admission. Potassium was low today, so we will replete prior to discharge. Since we are discharging him on half the loop diuretic dose that he has received in the hospital, he should not need ongoing repletion.  - Potassium chloride 40 mEq PO for one dose - Lasix 40 mg daily  - Lisinopril 10 mg daily  - Start metoprolol tartrate 25 mg twice daily  - Outpatient cardiology follow up for stress testing and further management   2. Alcohol abuse: Largely asymptomatic withdrawal while on Librium taper. Discussed importance of alcohol cessation in setting of his liver disease and explained both Librium taper and Naltrexone use. He expressed understanding.  - Librium 50 mg BID for 3 days, then 50 mg daily for 3 days  -  Naltrexone 50 mg once daily  - Social work to provide information for [facility] and other local resources   3. Cirrhosis: Thrombocytopenia improved this morning. Hep A and hep B panels pending.  - Recommend outpatient follow up with EGD  - Follow up hepatitis labs  4. Hypertension: Hypertensive overnight.  - Discharge on antihypertensives as above - Outpatient cardiology follow up    This is a Medical Student Note.  The care of the patient was discussed with Dr. Earlene PlaterWallace and the assessment and plan formulated with their assistance.  Please see  their attached note for official documentation of the daily encounter.   LOS: 3 days     Marylou FlesherKatherine Stephaie Dardis, MS3 [employer]  11/02/2015, 2:32 PM

## 2015-11-02 NOTE — Evaluation (Signed)
Physical Therapy Evaluation Patient Details Name: Cannon Arreola MRN: [id number] DOB: Aug 16, 1966 Today's Date: 11/02/2015   History of Present Illness  Mr. Mohamud Mrozek is a 50 y.o. man with past medical history of hypertension and alcohol abuse who presented to the emergency department because he feels that he cannot breath or catch his breath and he "feels like an elephant is sitting on my chest". Pt also with hx of L knee issues.  Clinical Impression  Pt admitted with above diagnosis. Pt currently with functional limitations due to the deficits listed below (see PT Problem List). Pt will benefit from skilled PT to increase their independence and safety with mobility to allow discharge to the venue listed below.  Pt with generalized unsteadiness with veering to the R with gait and c/o L knee pain.  Recommend HHPT, RW, and 3-1 BSC.  Pt with decreased balance during SPT and verbalized understanding that he needs to have someone with him during mobility until he gets stronger.     Follow Up Recommendations Home health PT;Supervision for mobility/OOB    Equipment Recommendations  Rolling walker with 5" wheels;3in1 (PT)    Recommendations for Other Services       Precautions / Restrictions Precautions Precautions: Fall      Mobility  Bed Mobility Overal bed mobility: Modified Independent             General bed mobility comments: heavy use of rail  Transfers Overall transfer level: Needs assistance Equipment used: Rolling walker (2 wheeled) Transfers: Sit to/from [employer] Sit to Stand: Min assist Stand pivot transfers: Min assist;Mod assist       General transfer comment: sit to stand with multiple attempts before PT gave MIN A.  After gait, sat on bed, then when he went to stand to RW to get in recliner L knee weakness had needed MOD A for SPT and poorly controlled descent to recliner  Ambulation/Gait Ambulation/Gait assistance: Min guard Ambulation  Distance (Feet): 195 Feet Assistive device: Rolling walker (2 wheeled) Gait Pattern/deviations: Decreased stance time - left;Decreased step length - right;Antalgic;Drifts right/left Gait velocity: decreased   General Gait Details: Pt tending to drift right, hitting wall a few times.  waiting til last second to go around an obstacle in the hallway. Educated on home safety and need for someone to be with him. 2/4 dyspnea and c/o 5/10 L knee pain  Stairs            Wheelchair Mobility    Modified Rankin (Stroke Patients Only)       Balance Overall balance assessment: Needs assistance   Sitting balance-Leahy Scale: Good       Standing balance-Leahy Scale: Poor Standing balance comment: requires UE support due to L knee pain and balance deficits                             Pertinent Vitals/Pain Pain Assessment: No/denies pain    Home Living Family/patient expects to be discharged to:: Private residence Living Arrangements: Alone   Type of Home: Other(Comment) (hotel) Home Access: Elevator;Stairs to enter   Secretary/administrator of Steps: girlfriend's 3 steps with 2 rails, son's 2 steps with no rails Home Layout: One level Home Equipment: None Additional Comments: Plans on d/c to son or girlfriend's house    Prior Function Level of Independence: Independent         Comments: Paints aircraft, 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      Hand Dominance        Extremity/Trunk Assessment   Upper Extremity Assessment: Overall WFL for tasks assessed           Lower Extremity Assessment: Overall WFL for tasks assessed;Generalized weakness      Cervical / Trunk Assessment: Normal  Communication   Communication: No difficulties  Cognition Arousal/Alertness: Awake/alert Behavior During Therapy: WFL for tasks assessed/performed Overall Cognitive Status: Within Functional Limits for tasks assessed                       General Comments General comments (skin integrity, edema, etc.): Pt asking about using treadmill.  Told to hold off on using it at this time and get stronger first.    Exercises        Assessment/Plan    PT Assessment    PT Diagnosis Difficulty walking;Generalized weakness   PT Problem List    PT Treatment Interventions     PT Goals (Current goals can be found in the Care Plan section) Acute Rehab PT Goals Patient Stated Goal: to get stronger PT Goal Formulation: With patient Time For Goal Achievement: 11/09/15 Potential to Achieve Goals: Good    Frequency     Barriers to discharge        Co-evaluation               End of Session Equipment Utilized During Treatment: Gait belt Activity Tolerance: Patient limited by fatigue;Patient limited by pain Patient left: in chair;with call bell/phone within reach;with chair alarm set Nurse Communication: Mobility status         Time: 1202-1229 PT Time Calculation (min) (ACUTE ONLY): 27 min   Charges:   PT Evaluation $PT Eval Moderate Complexity: 1 Procedure PT Treatments $Gait Training: 8-22 mins   PT G Codes:        Rateel Beldin LUBECK 11/02/2015, 1:51 PM

## 2015-11-02 NOTE — Telephone Encounter (Signed)
TOC phone call.Marland Kitchen. Appt on 11/07/15 w/ Wilburt FinlayBryan [facility]   Thanks

## 2015-11-02 NOTE — Discharge Instructions (Signed)
[name],  It was a pleasure taking care of you while you were in the hospital.  You were admitted due to having volume overload related to your liver disease from alcohol, high blood pressure.    We are discharging you on three medications to help with this: 1.  Lasix 40mg  daily 2.  Lisinopril 10mg  daily 3.  Metoprolol 25mg  twice per day  It is important to take these medications on a daily basis.  For your alcohol withdrawal, please take Librium 50mg  twice per day for 3 more days, then 50mg  daily for 3 days, then stop taking.  We are also giving you Naltrexone 50mg  to take daily in order to help with your alcohol addiction.  Take care, Dr. Earlene PlaterWallace

## 2015-11-02 NOTE — Care Management Note (Addendum)
Case Management Note  Patient Details  Name: Alexander Velasquez MRN: [id number] Date of Birth: 09/16/1966  Subjective/Objective:     Pt admitted for SOB and ETOH Abuse- initiated on CIWA Protocol. Plan will be for d/c home.                Action/Plan: CM did speak with MD in ref to disposition needs. MD stated that the pt will be followed up at the Clinic- [facility] without a qualifying diagnosis. Pt has no insurance and the Clinic Social Worker from the [facility] will assist with Medicaid. Rolling Walker to be delivered to room before d/c.    Expected Discharge Date:                  Expected Discharge Plan:  Home/Self Care  In-House Referral:  NA  Discharge planning Services  CM Consult  Post Acute Care Choice:  NA Choice offered to:  NA  DME Arranged:  N/A DME Agency:  NA  HH Arranged:  NA HH Agency:  NA  Status of Service:  Completed, signed off  Medicare Important Message Given:    Date Medicare IM Given:    Medicare IM give by:    Date Additional Medicare IM Given:    Additional Medicare Important Message give by:     If discussed at Long Length of Stay Meetings, dates discussed:    Additional Comments: 1614 11-02-15  Tomi BambergerBrenda Graves-Bigelow, RN,BSN [phone] Pt will be able to utilize [facility] for most medications for the $4.00 list. CM [facility] and the Librium will be $9.00 and the naltrexone 50 MG tablet cost will be 23.10. No further needs from CM at this time.    Gala LewandowskyGraves-Bigelow, Rayansh Herbst Kaye, RN 11/02/2015, 2:08 PM

## 2015-11-02 NOTE — Clinical Social Work Note (Signed)
Patient states that he is not agreeable to facility placement and will return home at discharge. He claims he will have 24hr supervision and support at home. He states his girlfriend will be able to pick him up "around 6:15PM." Substance abuse resources given to patient. CSW signing off.   Roddie McBryant Tawanda Schall MSW, [place]CS[place], [place], [phone]

## 2015-11-02 NOTE — Discharge Summary (Signed)
Name: Alexander Velasquez MRN: [id number] DOB: Dec 27, 1965 50 y.o. PCP: No primary care provider on file.  Date of Admission: 10/30/2015 11:36 AM Date of Discharge: 11/02/2015 Attending Physician: Tyson Alias, MD  Discharge Diagnosis: 1. Volume Overload due to HFpEF 2. HTN 3. Cirrhosis 4. HLD 5. Alcohol Abuse 6. Bilateral Knee Pain   Active Problems:   Shortness of breath   Acute pulmonary edema (HCC)   Pain in the chest   Essential hypertension  Discharge Medications:   Medication List    STOP taking these medications        ibuprofen 200 MG tablet  Commonly known as:  ADVIL,MOTRIN      TAKE these medications        chlordiazePOXIDE 25 MG capsule  Commonly known as:  LIBRIUM  Take 50mg  twice daily for 3 days, then take 50mg  daily for 3 days then stop.     diclofenac sodium 1 % Gel  Commonly known as:  VOLTAREN  Apply 2g topically to your knees four times per day.     folic acid 1 MG tablet  Commonly known as:  FOLVITE  Take 1 tablet (1 mg total) by mouth daily.     furosemide 40 MG tablet  Commonly known as:  LASIX  Take 1 tablet (40 mg total) by mouth daily.     lisinopril 10 MG tablet  Commonly known as:  PRINIVIL,ZESTRIL  Take 1 tablet (10 mg total) by mouth daily.     metoprolol tartrate 25 MG tablet  Commonly known as:  LOPRESSOR  Take 1 tablet (25 mg total) by mouth 2 (two) times daily.     multivitamin with minerals Tabs tablet  Take 1 tablet by mouth daily.     naltrexone 50 MG tablet  Commonly known as:  DEPADE  Take 1 tablet (50 mg total) by mouth daily.     tetrahydrozoline-zinc 0.05-0.25 % ophthalmic solution  Commonly known as:  VISINE-AC  Place 2 drops into both eyes 3 (three) times daily as needed.     thiamine 100 MG tablet  Take 1 tablet (100 mg total) by mouth daily.        Disposition and follow-up:   Mr.Alexander Velasquez was discharged from [facility] in Stable condition.    1  At the hospital follow  up visit please address:  - if patient able to obtain medications ordered at discharge and assess for adherance - check BP and HR on current HFpEF and HTN regimen, may need to titrate medications.  May need potassium supplementation on Lasix as potassium was slightly low on Lasix BID dosing - if patient is abstaining from EtOH, see if he needs any further Social Work assistance with resources.  We discharged home with Librium taper and prescription for daily Naltrexone - patient needs GI follow up for EGD given newly diagnosed cirrhosis - consider referral for sleep study to evaluate for suspected sleep apnea - please refer to CSW Lynnae January for assistance with Medicaid approval - once Medicaid approved, please put in for referral to [facility] PT based on recs received in the hospital  2.  Labs / imaging needed at time of follow-up: EGD, BMET, Magnesium, CBC  3.  Pending labs/ test needing follow-up: hepatitis A and B serologies  Follow-up Appointments: Follow-up Information    Follow up with HAGER, BRYAN, PA-C On 11/07/2015.   Specialties:  Physician Assistant, Radiology, Interventional Cardiology   Why:  9:30 AM   Contact information:  [address] [place] [address] [phone]       Follow up with Griffin Basil, MD On 11/09/2015.   Specialty:  Internal Medicine   Why:  appointment at 3:15pm   Contact information:   [address] [place] [address] [phone]       Discharge Instructions: Discharge Instructions    Walker rolling    Complete by:  As directed            Consultations: Treatment Team:  Rounding Lbcardiology, MD  Procedures Performed:  Dg Chest 2 View  10/31/2015  CLINICAL DATA:  Chest pain shortness of breath EXAM: CHEST  2 VIEW COMPARISON:  10/30/2015 FINDINGS: Old lateral right rib fractures. Stable mild cardiac enlargement. Vascular pattern normal. No consolidation or effusion. IMPRESSION: No active cardiopulmonary disease.  Electronically Signed   By: Esperanza Heir M.D.   On: 10/31/2015 12:56   Ct Angio Chest Pe W/cm &/or Wo Cm  10/30/2015  CLINICAL DATA:  Mid chest pain, shortness of breath. EXAM: CT ANGIOGRAPHY CHEST WITH CONTRAST TECHNIQUE: Multidetector CT imaging of the chest was performed using the standard protocol during bolus administration of intravenous contrast. Multiplanar CT image reconstructions and MIPs were obtained to evaluate the vascular anatomy. CONTRAST:  OMNIPAQUE IOHEXOL 350 MG/ML SOLN COMPARISON:  None. FINDINGS: The study was repeated due to suboptimal bolus on the initial imaging. Repeat imaging continues to remain somewhat suboptimal opacification of the pulmonary arterial tree, but no large, central or segmental pulmonary emboli noted. There is cardiomegaly. Mild vascular congestion noted with ground-glass opacities in the lingula and lower lobes bilaterally, possibly early edema. No pleural effusions. No mediastinal, hilar, or axillary adenopathy. Chest wall soft tissues are unremarkable. Imaging into the upper abdomen demonstrates nodular contours of the liver compatible with cirrhosis. Diffusely low density suggesting fatty infiltration. No acute bony abnormality or focal bone lesion. Review of the MIP images confirms the above findings. IMPRESSION: Cardiomegaly. Ground-glass opacity in the lower lobes and lingula may reflect early edema. No visible pulmonary emboli. Despite repeating, evaluation of the peripheral vessels remain suboptimal. Nodular, low-density liver compatible with cirrhosis and fatty infiltration. Electronically Signed   By: Charlett Nose M.D.   On: 10/30/2015 15:16   Dg Chest Portable 1 View  10/30/2015  CLINICAL DATA:  Left-sided chest pain for 2 days, initial encounter EXAM: PORTABLE CHEST - 1 VIEW COMPARISON:  None. FINDINGS: The heart size and mediastinal contours are within normal limits. Both lungs are clear. The visualized skeletal structures are unremarkable.  IMPRESSION: No active disease. Electronically Signed   By: Alcide Clever M.D.   On: 10/30/2015 12:22   Dg Knee Complete 4 Views Left  10/31/2015  CLINICAL DATA:  Bilateral knee pain EXAM: LEFT KNEE - COMPLETE 4+ VIEW COMPARISON:  None. FINDINGS: Four views of the left knee submitted. There is narrowing of medial joint compartment. Spurring of medial femoral condyle and medial tibial plateau. Narrowing of patellofemoral joint space. Small joint effusion. Mild spurring of patella. No acute fracture or subluxation. IMPRESSION: No acute fracture or subluxation. Osteoarthritic changes as described above. Electronically Signed   By: Natasha Mead M.D.   On: 10/31/2015 12:54   Dg Knee Complete 4 Views Right  10/31/2015  CLINICAL DATA:  Bilateral knee pain EXAM: RIGHT KNEE - COMPLETE 4+ VIEW COMPARISON:  None. FINDINGS: Four views of the right knee submitted. Postsurgical changes post ACL repair are noted in distal femur and proximal tibia. There is significant narrowing of medial  joint compartment. Calcifications of medial collateral ligament are noted. There is mild chondrocalcinosis. Mild sclerosis of medial tibial plateau. Trace joint effusion. Narrowing of patellofemoral joint space. Mild spurring of patella. No acute fracture or subluxation. IMPRESSION: No acute fracture or subluxation. Significant osteoarthritic changes as described above. Postsurgical changes post ACL repair. Electronically Signed   By: Natasha MeadLiviu  Pop M.D.   On: 10/31/2015 12:56    2D Echo:  - Left ventricle: The cavity size was normal. There was mild concentric hypertrophy. Systolic function was normal. The estimated ejection fraction was in the range of 55% to 60%. Although no diagnostic regional wall motion abnormality was identified, this possibility cannot be completely excluded on the basis of this study. Doppler parameters are consistent with abnormal left ventricular relaxation (grade 1 diastolic dysfunction). -  Mitral valve: Mildly calcified annulus. - Left atrium: The atrium was mildly dilated.  Cardiac Cath: none  Admission HPI:  [name] is a 50 y.o. man with past medical history of hypertension and alcohol abuse who presented to the emergency department because he feels that he cannot breath or catch his breath and he "feels like an elephant is sitting on my chest". Onset of symptoms began yesterday after painting a car in the garage while wearing a respirator when he became short of breath and developed this chest pressure. He has no prior history of similar symptoms. Today, patient was at work when similar symptoms recurred and he felt like he was going to pass out. However, patient never did lose consciousness. He currently describes the pain he has as intermittently sharp, left-sided, with constant sensation of pressure and being unable to adequately take a deep breath due to restriction. His shortness of breath is present at rest and worsened with exertion. His pain is currently 4/10 but was as bad as 8/10 or so prior to receiving morphine. Patient has no significant cardiac or past medical history because he does not regularly follow with a PCP. He carries a diagnosis of HTN for which he takes Lisinopril and a "fluid pill" that he cannot recall the name of. He received these medications after a 1 time visit with [facility], but has not followed up since. Patient also takes a aspirin 81mg  daily for primary prevention. Patient has not taken fluid pill for past 3 weeks due to running out of medication. During this time, he has noticed increased lower extremity edema, abdominal swelling. He has stable 3-pillow orthopnea that has not changed.  Patient also with significant alcohol use as reported by both himself and his fiance. He drinks a case of beer daily (24 beers) and has been doing so "for many years". His last drink was this morning. He has never withdrawn or had  seizures from alcohol withdrawal. He has apparently spent the past 2 weeks at a friends house where they had been drinking more so than usual. Patient denies any other drug use but does report taking ibuprofen 200mg  about 10 times per day. When asked how long he has been doing so, patient replied, "forever".  On review of systems, patient reports lower extremity swelling, chronic numbness and tingling in his hands and feet that has worsened in last 2 weeks, bleeding from his mouth (states he wakes up with blood on his pillow), cough. He denies any nausea, vomiting, diarrhea, constipation, melena, abdominal pain, hematemesis, GERD.  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 He has one living child who is healthy. Other child passed away at young age due to complications from being paraplegic. He is not a smoker, denies drug use, alcohol use described above.   Patient has no past medical history of DM, MI, HLD, heart failure. Family history significant for atrial fibrillation in mom and brother, COPD in mom.  Hospital Course by problem list: Active Problems:   Shortness of breath   Acute pulmonary edema (HCC)   Pain in the chest   Essential hypertension   Volume Overload: due to suspected HFpEF.  2D echo on 11/01/2015 showed in EF of 55-60% with grade 1 diastolic dysfunction.  Overall, patient diuresed well and was net 4 liters out by day of discharge.  He clinically felt much better after this diuresis with improvement in chest pain and shortness of breath.  At discharge, we continued his Lasix 40mg  daily, Lisinopril 10mg  daily.  We also added low dose beta blocker with metoprolol tartrate 25mg  BID.  Patient has outpatient cardiology follow up scheduled.  His potassium was low on the BID dosing of Lasix in hospital.  However, he  was discharged on just daily Lasix.  Therefore, we will hold off on starting potassium supplementation at discharge, recommend checking BMET at hospital follow up, and proceed from there.  He may need titrating of these medications at follow up.  HTN: prior to admission, patient was on Lisinopril and a "fluid pill" (likely Lasix), but had been non-compliant with the fluid pill.  We continued Lisinopril at 10mg  daily and diuresis with Lasix 40mg  BID.  His BP and HR were still elevated so we added low dose beta blocker prior to discharge.  Discharge medications are: Lasix 40mg  daily, Lisinopril 10mg  daily, and Metoprolol tartrate 25mg  BID.    HLD: Lipid panel obtained revealed TC 296, LDL 182, HDL 92.  His current ASCVD 10-yr risk is 5.6%. It would be reasonable to consider a moderate intensity statin in this patient although not recommended. His A1C was not indicative of having diabetes at 5.3. Will hold off on starting a statin at this point given his liver disease  Alcohol Abuse: CT angio showed nodular, low density liver compatible with cirrhosis and fatty infiltration. Patient did experience some tremors and visual hallucinations, but responded well to scheduled Librium and as needed Ativan.  Patient with no further hallucinations at discharge.At discharge, we will continue with Librium 50mg  BID x 3 days, then daily x 3 days, then off.  We continued thiamine daily, folic acid daily, multivitamin daily at discharge.  CSW provided assistance with alcohol abuse counseling.  Cirrhosis: we were able to make a new diagnosis of alcoholic cirrhosis based on alcohol history, elevated bilirubin, low albumin, thrombocytopenia, and nodular liver seen on CT scan.  We are checking hepatitis serologies and based on those results, patient will need immunization as appropriate, EGD to screen for varices, and intensive alcohol cessation therapy along with naltrexone on discharge.  Bilateral Osteoarthritis of the  Knee: Patient has a history of osteoarthritis in both knees. No evidence of new fracture or subluxation on x-rays obtained during hospitalization.  Continue Voltaren gel 2 g four times daily for pain.  PT evaluated patient and recommended home health PT.  Due to insurance issues, this would be out-of-pocket.  Patient will need PT services ordered at follow up once Medicaid approved.  Discharge Vitals:  BP 136/80 mmHg  Pulse 119  Temp(Src) 98.6 F (37 C) (Oral)  Resp 18  Ht 5\' 7"  (1.702 m)  Wt 243 lb 3.2 oz (110.315 kg)  BMI 38.08 kg/m2  SpO2 95%  Discharge Labs:  Results for orders placed or performed during the hospital encounter of 10/30/15 (from the past 24 hour(s))  CBC     Status: Abnormal   Collection Time: 11/02/15  5:31 AM  Result Value Ref Range   WBC 7.3 4.0 - 10.5 K/uL   RBC 4.31 4.22 - 5.81 MIL/uL   Hemoglobin 13.9 13.0 - 17.0 g/dL   HCT 40.9 81.1 - 91.4 %   MCV 90.7 78.0 - 100.0 fL   MCH 32.3 26.0 - 34.0 pg   MCHC 35.5 30.0 - 36.0 g/dL   RDW 78.2 95.6 - 21.3 %   Platelets 69 (L) 150 - 400 K/uL  Basic metabolic panel     Status: Abnormal   Collection Time: 11/02/15  5:31 AM  Result Value Ref Range   Sodium 137 135 - 145 mmol/L   Potassium 3.4 (L) 3.5 - 5.1 mmol/L   Chloride 102 101 - 111 mmol/L   CO2 27 22 - 32 mmol/L   Glucose, Bld 98 65 - 99 mg/dL   BUN 14 6 - 20 mg/dL   Creatinine, Ser 0.86 0.61 - 1.24 mg/dL   Calcium 8.8 (L) 8.9 - 10.3 mg/dL   GFR calc non Af Amer >60 >60 mL/min   GFR calc Af Amer >60 >60 mL/min   Anion gap 8 5 - 15    Signed: Gwynn Burly, DO 11/02/2015, 2:08 PM    Services Ordered on Discharge: none, will need HH PT once Medicaid approved Equipment Ordered on Discharge: rolling walker

## 2015-11-02 NOTE — Progress Notes (Signed)
Subjective:  Clinically improved after diuresis  Objective:  Temp:  [98.2 F (36.8 C)-99 F (37.2 C)] 98.9 F (37.2 C) (01/13 0735) Pulse Rate:  [102-111] 102 (01/13 0735) Resp:  [0-26] 21 (01/13 0735) BP: (132-170)/(80-98) 154/94 mmHg (01/13 0735) SpO2:  [91 %-93 %] 93 % (01/13 0735) Weight:  [243 lb 3.2 oz (110.315 kg)] 243 lb 3.2 oz (110.315 kg) (01/13 0437) Weight change:   Intake/Output from previous day: 01/12 0701 - 01/13 0700 In: 360 [P.O.:360] Out: 500 [Urine:500]  Intake/Output from this shift: Total I/O In: 120 [P.O.:120] Out: 300 [Urine:300]  Physical Exam: General appearance: alert and no distress Neck: no adenopathy, no carotid bruit, no JVD, supple, symmetrical, trachea midline and thyroid not enlarged, symmetric, no tenderness/mass/nodules Lungs: clear to auscultation bilaterally Heart: regular rate and rhythm, S1, S2 normal, no murmur, click, rub or gallop Extremities: extremities normal, atraumatic, no cyanosis or edema  Lab Results: Results for orders placed or performed during the hospital encounter of 10/30/15 (from the past 48 hour(s))  Urine rapid drug screen (hosp performed)     Status: Abnormal   Collection Time: 10/31/15 11:40 AM  Result Value Ref Range   Opiates POSITIVE (A) NONE DETECTED   Cocaine NONE DETECTED NONE DETECTED   Benzodiazepines NONE DETECTED NONE DETECTED   Amphetamines NONE DETECTED NONE DETECTED   Tetrahydrocannabinol POSITIVE (A) NONE DETECTED   Barbiturates NONE DETECTED NONE DETECTED    Comment:        DRUG SCREEN FOR MEDICAL PURPOSES ONLY.  IF CONFIRMATION IS NEEDED FOR ANY PURPOSE, NOTIFY LAB WITHIN 5 DAYS.        LOWEST DETECTABLE LIMITS FOR URINE DRUG SCREEN Drug Class       Cutoff (ng/mL) Amphetamine      1000 Barbiturate      200 Benzodiazepine   037 Tricyclics       048 Opiates          300 Cocaine          300 THC              50   TSH     Status: None   Collection Time: 10/31/15 12:08 PM    Result Value Ref Range   TSH 1.899 0.350 - 4.500 uIU/mL  CBC     Status: Abnormal   Collection Time: 11/01/15  4:08 AM  Result Value Ref Range   WBC 5.0 4.0 - 10.5 K/uL   RBC 4.28 4.22 - 5.81 MIL/uL   Hemoglobin 13.3 13.0 - 17.0 g/dL   HCT 39.8 39.0 - 52.0 %   MCV 93.0 78.0 - 100.0 fL   MCH 31.1 26.0 - 34.0 pg   MCHC 33.4 30.0 - 36.0 g/dL   RDW 13.2 11.5 - 15.5 %   Platelets 40 (L) 150 - 400 K/uL    Comment: CONSISTENT WITH PREVIOUS RESULT  Basic metabolic panel     Status: Abnormal   Collection Time: 11/01/15  4:08 AM  Result Value Ref Range   Sodium 138 135 - 145 mmol/L   Potassium 3.3 (L) 3.5 - 5.1 mmol/L   Chloride 101 101 - 111 mmol/L   CO2 28 22 - 32 mmol/L   Glucose, Bld 100 (H) 65 - 99 mg/dL   BUN 13 6 - 20 mg/dL   Creatinine, Ser 0.70 0.61 - 1.24 mg/dL   Calcium 8.6 (L) 8.9 - 10.3 mg/dL   GFR calc non Af Amer >60 >60 mL/min   GFR  calc Af Amer >60 >60 mL/min    Comment: (NOTE) The eGFR has been calculated using the CKD EPI equation. This calculation has not been validated in all clinical situations. eGFR's persistently <60 mL/min signify possible Chronic Kidney Disease.    Anion gap 9 5 - 15  CBC     Status: Abnormal   Collection Time: 11/02/15  5:31 AM  Result Value Ref Range   WBC 7.3 4.0 - 10.5 K/uL    Comment: REPEATED TO VERIFY WHITE COUNT CONFIRMED ON SMEAR    RBC 4.31 4.22 - 5.81 MIL/uL   Hemoglobin 13.9 13.0 - 17.0 g/dL   HCT 39.1 39.0 - 52.0 %   MCV 90.7 78.0 - 100.0 fL   MCH 32.3 26.0 - 34.0 pg   MCHC 35.5 30.0 - 36.0 g/dL   RDW 13.2 11.5 - 15.5 %   Platelets 69 (L) 150 - 400 K/uL    Comment: SPECIMEN CHECKED FOR CLOTS REPEATED TO VERIFY PLATELET COUNT CONFIRMED BY SMEAR   Basic metabolic panel     Status: Abnormal   Collection Time: 11/02/15  5:31 AM  Result Value Ref Range   Sodium 137 135 - 145 mmol/L   Potassium 3.4 (L) 3.5 - 5.1 mmol/L   Chloride 102 101 - 111 mmol/L   CO2 27 22 - 32 mmol/L   Glucose, Bld 98 65 - 99 mg/dL   BUN 14  6 - 20 mg/dL   Creatinine, Ser 0.86 0.61 - 1.24 mg/dL   Calcium 8.8 (L) 8.9 - 10.3 mg/dL   GFR calc non Af Amer >60 >60 mL/min   GFR calc Af Amer >60 >60 mL/min    Comment: (NOTE) The eGFR has been calculated using the CKD EPI equation. This calculation has not been validated in all clinical situations. eGFR's persistently <60 mL/min signify possible Chronic Kidney Disease.    Anion gap 8 5 - 15    Imaging: Imaging results have been reviewed  Tele- SR/ST 100  Assessment/Plan:   1. Active Problems: 2.   Shortness of breath 3.   Acute pulmonary edema (HCC) 4.   Pain in the chest 5.   Essential hypertension 6.   Time Spent Directly with Patient:  20 minutes  Length of Stay:  LOS: 3 days   Admitted with SOB. I/O neg 4 Liters. Good diuresis. Feeling much better. 2D shows nl LV fxn (couldn't comment of Diastolic Function). BNP was low. BP and HR still elevated. We've discussed dietary modification (NA and ETOH). Need to replete K and Mg (prob send home on PO). Can start low dose BB in addition for increased HR and BP (i.e. Lopressor 25 mg PO BID), in addition to ACE-I and PO lasix. We will arrange OP follow up.   Quay Burow 11/02/2015, 10:55 AM

## 2015-11-02 NOTE — Progress Notes (Signed)
Subjective: Patient seen and examined this morning on rounds.  No acute events overnight.  Continues to feel better after diuresis.  No chest pain, shortness of breath.  Not having tremors.   Objective: Vital signs in last 24 hours: Filed Vitals:   11/02/15 0437 11/02/15 0439 11/02/15 0735 11/02/15 1147  BP:  170/95 154/94 136/80  Pulse:  104 102 101  Temp: 98.2 F (36.8 C)  98.9 F (37.2 C) 98.6 F (37 C)  TempSrc: Oral  Oral Oral  Resp:  21 21 18   Height:      Weight: 243 lb 3.2 oz (110.315 kg)     SpO2:  91% 93% 95%   Weight change:   Intake/Output Summary (Last 24 hours) at 11/02/15 1149 Last data filed at 11/02/15 0928  Gross per 24 hour  Intake    240 ml  Output    300 ml  Net    -60 ml   General: lying flat in bed, no distress HEENT: EOMI, no scleral icterus Pulm: no increased work, respirations non-labored Abd: obese Neuro: alert and oriented, no focal deficits   Lab Results: Basic Metabolic Panel:  Recent Labs Lab 10/30/15 2205  11/01/15 0408 11/02/15 0531  NA  --   < > 138 137  K  --   < > 3.3* 3.4*  CL  --   < > 101 102  CO2  --   < > 28 27  GLUCOSE  --   < > 100* 98  BUN  --   < > 13 14  CREATININE  --   < > 0.70 0.86  CALCIUM  --   < > 8.6* 8.8*  MG 1.6*  --   --   --   < > = values in this interval not displayed. Liver Function Tests:  Recent Labs Lab 10/30/15 1153  AST 99*  ALT 30  ALKPHOS 64  BILITOT 1.5*  PROT 6.6  ALBUMIN 3.1*   No results for input(s): LIPASE, AMYLASE in the last 168 hours. No results for input(s): AMMONIA in the last 168 hours. CBC:  Recent Labs Lab 10/30/15 1153  11/01/15 0408 11/02/15 0531  WBC 5.7  < > 5.0 7.3  NEUTROABS 3.2  --   --   --   HGB 13.2  < > 13.3 13.9  HCT 38.6*  < > 39.8 39.1  MCV 91.7  < > 93.0 90.7  PLT 72*  < > 40* 69*  < > = values in this interval not displayed. Cardiac Enzymes:  Recent Labs Lab 10/30/15 2205 10/31/15 0217 10/31/15 0844  TROPONINI 0.04* 0.03 0.03    BNP: No results for input(s): PROBNP in the last 168 hours. D-Dimer: No results for input(s): DDIMER in the last 168 hours. CBG: No results for input(s): GLUCAP in the last 168 hours. Hemoglobin A1C:  Recent Labs Lab 10/30/15 2205  HGBA1C 5.3   Fasting Lipid Panel:  Recent Labs Lab 10/31/15 0217  CHOL 296*  HDL 92  LDLCALC 182*  TRIG 112  CHOLHDL 3.2   Thyroid Function Tests:  Recent Labs Lab 10/31/15 1208  TSH 1.899   Coagulation: No results for input(s): LABPROT, INR in the last 168 hours. Anemia Panel:  Recent Labs Lab 10/30/15 2205  VITAMINB12 673   Urine Drug Screen: Drugs of Abuse     Component Value Date/Time   LABOPIA POSITIVE* 10/31/2015 1140   COCAINSCRNUR NONE DETECTED 10/31/2015 1140   LABBENZ NONE DETECTED 10/31/2015 1140  AMPHETMU NONE DETECTED 10/31/2015 1140   THCU POSITIVE* 10/31/2015 1140   LABBARB NONE DETECTED 10/31/2015 1140    Alcohol Level: No results for input(s): ETH in the last 168 hours. Urinalysis: No results for input(s): COLORURINE, LABSPEC, PHURINE, GLUCOSEU, HGBUR, BILIRUBINUR, KETONESUR, PROTEINUR, UROBILINOGEN, NITRITE, LEUKOCYTESUR in the last 168 hours.  Invalid input(s): APPERANCEUR   Micro Results: Recent Results (from the past 240 hour(s))  MRSA PCR Screening     Status: None   Collection Time: 10/30/15  8:51 PM  Result Value Ref Range Status   MRSA by PCR NEGATIVE NEGATIVE Final    Comment:        The GeneXpert MRSA Assay (FDA approved for NASAL specimens only), is one component of a comprehensive MRSA colonization surveillance program. It is not intended to diagnose MRSA infection nor to guide or monitor treatment for MRSA infections.    Studies/Results: Dg Chest 2 View  10/31/2015  CLINICAL DATA:  Chest pain shortness of breath EXAM: CHEST  2 VIEW COMPARISON:  10/30/2015 FINDINGS: Old lateral right rib fractures. Stable mild cardiac enlargement. Vascular pattern normal. No consolidation or  effusion. IMPRESSION: No active cardiopulmonary disease. Electronically Signed   By: Esperanza Heir M.D.   On: 10/31/2015 12:56   Dg Knee Complete 4 Views Left  10/31/2015  CLINICAL DATA:  Bilateral knee pain EXAM: LEFT KNEE - COMPLETE 4+ VIEW COMPARISON:  None. FINDINGS: Four views of the left knee submitted. There is narrowing of medial joint compartment. Spurring of medial femoral condyle and medial tibial plateau. Narrowing of patellofemoral joint space. Small joint effusion. Mild spurring of patella. No acute fracture or subluxation. IMPRESSION: No acute fracture or subluxation. Osteoarthritic changes as described above. Electronically Signed   By: Natasha Mead M.D.   On: 10/31/2015 12:54   Dg Knee Complete 4 Views Right  10/31/2015  CLINICAL DATA:  Bilateral knee pain EXAM: RIGHT KNEE - COMPLETE 4+ VIEW COMPARISON:  None. FINDINGS: Four views of the right knee submitted. Postsurgical changes post ACL repair are noted in distal femur and proximal tibia. There is significant narrowing of medial joint compartment. Calcifications of medial collateral ligament are noted. There is mild chondrocalcinosis. Mild sclerosis of medial tibial plateau. Trace joint effusion. Narrowing of patellofemoral joint space. Mild spurring of patella. No acute fracture or subluxation. IMPRESSION: No acute fracture or subluxation. Significant osteoarthritic changes as described above. Postsurgical changes post ACL repair. Electronically Signed   By: Natasha Mead M.D.   On: 10/31/2015 12:56   Medications:  Scheduled Meds: . chlordiazePOXIDE  50 mg Oral TID  . diclofenac sodium  2 g Topical QID  . folic acid  1 mg Oral Daily  . furosemide  40 mg Oral BID  . lisinopril  10 mg Oral Daily  . multivitamin with minerals  1 tablet Oral Daily  . pantoprazole  40 mg Oral Daily  . thiamine  100 mg Intravenous Daily  . thiamine  100 mg Oral Daily   Continuous Infusions:  PRN Meds:.ipratropium-albuterol, LORazepam **OR** LORazepam,  morphine injection, nitroGLYCERIN, promethazine **OR** promethazine Assessment/Plan: Active Problems:   Shortness of breath   Acute pulmonary edema (HCC)   Pain in the chest   Essential hypertension  Chest Pain:  Resolved - Cardiology following, appreciate their recommendations  Volume Overload: suspected due to HFpEF with Echo yesterday showing preserved left ventricular function and grade 1 diastolic dysfunction.  He is clinically improved with diuresis (net negative 4L since admission.   - follow up with cardiology outpatient -  continue Lasix - continue Lisinopril - start low dose beta blocker - will need BMP at hospital follow-up  HTN: on lisinopril and "fluid pill" at home - continue Lasix - continue Lisinopril - start low dose beta blocker  HLD: TC 296, LDL 182, HDL 92 - current ASCVD 10-yr risk is 5.6%.  It would be reasonable to consider a moderate intensity statin in this patient although not recommended.  His A1C was not indicative of having diabetes at 5.3.  Will hold off on starting a statin at this point given his liver disease  Alcohol Abuse: CT angio showed nodular, low density liver compatible with cirrhosis and fatty infiltration. Patient with no further hallucinations.   - Continue Librium 50mg  BID x 3 days, then daily x 3 days at discharge - CSW consulted at admission, appreciate their assistance - thiamine daily, folic acid daily, multivitamin daily  Cirrhosis: thrombocytopenia continued this morning but better, HIV and hepatitis C were negative - hepatitis A and B serologies pending - monitor platelets - close outpatient follow up  FEN Fluids: none Electrolytes: potassium 3.4 today, given K-dur Nutrition: heart healthy  DVT PPx: SCDs  CODE: FULL  Dispo: Discharge home today after PT evaluation for his chronic knee pain.  The patient does not have a current PCP (No primary care provider on file.) and does need an [facility] hospital follow-up appointment  after discharge.  The patient does not have transportation limitations that hinder transportation to clinic appointments.  .Services Needed at time of discharge: Y = Yes, Blank = No PT:   OT:   RN:   Equipment:   Other:     LOS: 3 days   Gwynn Burly, DO 11/02/2015, 11:49 AM

## 2015-11-03 LAB — HEPATITIS A ANTIBODY, TOTAL: HEP A TOTAL AB: POSITIVE — AB

## 2015-11-03 LAB — HEPATITIS B SURFACE ANTIBODY,QUALITATIVE: Hep B S Ab: NONREACTIVE

## 2015-11-03 LAB — HEPATITIS B SURFACE ANTIGEN: Hepatitis B Surface Ag: NEGATIVE

## 2015-11-03 LAB — HEPATITIS B CORE ANTIBODY, TOTAL: Hep B Core Total Ab: NEGATIVE

## 2015-11-05 NOTE — Telephone Encounter (Signed)
LMTCB

## 2015-11-05 NOTE — Telephone Encounter (Signed)
[facility].  Patient understands to follow up with provider Wilburt FinlayBryan Hager, PA-C on 11/07/15 at 9:30am [facility]. Patient understands discharge instructions? yes Patient understands medications and regiment? yes Patient understands to bring all medications to this visit? yes

## 2015-11-07 ENCOUNTER — Ambulatory Visit: Payer: Self-pay | Admitting: Physician Assistant

## 2015-11-09 ENCOUNTER — Encounter: Payer: Self-pay | Admitting: Pulmonary Disease

## 2015-11-09 ENCOUNTER — Ambulatory Visit (INDEPENDENT_AMBULATORY_CARE_PROVIDER_SITE_OTHER): Payer: Self-pay | Admitting: Pulmonary Disease

## 2015-11-09 VITALS — BP 98/51 | HR 88 | Temp 97.5°F | Resp 18 | Ht 67.0 in | Wt 249.9 lb

## 2015-11-09 DIAGNOSIS — K7030 Alcoholic cirrhosis of liver without ascites: Secondary | ICD-10-CM

## 2015-11-09 DIAGNOSIS — I10 Essential (primary) hypertension: Secondary | ICD-10-CM

## 2015-11-09 DIAGNOSIS — F1021 Alcohol dependence, in remission: Secondary | ICD-10-CM

## 2015-11-09 DIAGNOSIS — Z23 Encounter for immunization: Secondary | ICD-10-CM

## 2015-11-09 NOTE — Assessment & Plan Note (Signed)
BP Readings from Last 3 Encounters:  11/09/15 98/51  11/02/15 127/86    Lab Results  Component Value Date   NA 137 11/02/2015   K 3.4* 11/02/2015   CREATININE 0.86 11/02/2015    Assessment: Blood pressure control: Mildly hypotensive Comments: He was taking HCTZ and a higher dose of lisinopril than what he was discharged on. Apparently, his family member went to a different pharmacy than where we sent his discharge medications and they filled old medications. This is the likely cause of his hypotension.  Plan: Medications:  Discontinue HCTZ and metoprolol. Continue lisinopril  daily and furosemide  daily. Other plans:  -Follow up in 1 month

## 2015-11-09 NOTE — Progress Notes (Signed)
   Subjective:    Patient ID: Alexander Velasquez, male    DOB: 1966/05/29, 50 y.o.   MRN: [id number]  HPI Alexander Velasquez is a 50 year old man with history of hypertension presenting for follow up of hospitalization for HFpEF and alcoholic cirrhosis.  He was hospitalized from 10/30/2015 to 11/02/2015 for volume overload due to HFpEF. He presented to the ED in [facility] yesterday for dizziness felt to be due to hypotension.  He reports he is doing well now. Still has some dizziness. He has stopped drinking alcohol. Denies tremors, hallucinations, cravings for alcohol.  Review of Systems Constitutional: no fevers/chills Respiratory: no shortness of breath Cardiovascular: no chest pain Gastrointestinal: no nausea/vomiting, no abdominal pain, no constipation, no diarrhea Genitourinary: no dysuria, no hematuria  Past Medical History  Diagnosis Date  . Hypertension     Current Outpatient Prescriptions on File Prior to Visit  Medication Sig Dispense Refill  . chlordiazePOXIDE (LIBRIUM) 25 MG capsule Take  twice daily for 3 days, then take  daily for 3 days then stop. 20 capsule 0  . diclofenac sodium (VOLTAREN) 1 % GEL Apply 2g topically to your knees four times per day. 1 Tube 2  . folic acid (FOLVITE) 1 MG tablet Take 1 tablet (1 mg total) by mouth daily. 30 tablet 2  . furosemide (LASIX) 40 MG tablet Take 1 tablet (40 mg total) by mouth daily. 30 tablet 2  . lisinopril (PRINIVIL,ZESTRIL) 10 MG tablet Take 1 tablet (10 mg total) by mouth daily. 30 tablet 2  . metoprolol tartrate (LOPRESSOR) 25 MG tablet Take 1 tablet (25 mg total) by mouth 2 (two) times daily. 60 tablet 2  . Multiple Vitamin (MULTIVITAMIN WITH MINERALS) TABS tablet Take 1 tablet by mouth daily. 30 tablet 2  . naltrexone (DEPADE) 50 MG tablet Take 1 tablet (50 mg total) by mouth daily. 30 tablet 2  . tetrahydrozoline-zinc (VISINE-AC) 0.05-0.25 % ophthalmic solution Place 2 drops into both eyes 3 (three) times daily as  needed.    . thiamine 100 MG tablet Take 1 tablet (100 mg total) by mouth daily. 30 tablet 2   No current facility-administered medications on file prior to visit.    Today's Vitals   11/09/15 1519  BP: 98/51  Pulse: 88  Temp: 97.5 F (36.4 C)  TempSrc: Oral  Resp: 18  Height:  (1.702 m)  Weight: 249 lb 14.4 oz (113.354 kg)  SpO2: 100%  PainSc: 7     Objective:   Physical Exam  Constitutional: He is oriented to person, place, and time. He appears well-developed and well-nourished. No distress.  Cardiovascular: Normal rate and regular rhythm.   Pulmonary/Chest: Effort normal and breath sounds normal. He has no wheezes. He has no rales.  Abdominal: Soft. He exhibits no distension. There is no tenderness.  Neurological: He is alert and oriented to person, place, and time. Coordination normal.  No asterixis on exam  Psychiatric: He has a normal mood and affect.    Assessment & Plan:  Please refer to problem based charting.

## 2015-11-09 NOTE — Patient Instructions (Addendum)
1. Take  of lisinopril daily (If you are using the  tablets, take 1/2 tablet)  2. Stop taking metoprolol and hydrochlorothiazide  3. You do not need to start taking lactulose. If you become confused or more sleepy than usual, please call the clinic. If it is after hours you can call [facility] and ask for the internal medicine resident on call.

## 2015-11-09 NOTE — Assessment & Plan Note (Addendum)
Assessment: He is doing well with alcohol cessation. No signs of hepatic encephalopathy today.  Plan: -Discontinue lactulose prescribed by ED. -Will refer him to GI for screening EGD -Hepatitis B vaccine given today. -Follow up in 1 month. Will be due for next hepatitis B shot in the series. -Defer screening US for now as there is not strong evidence for this.

## 2015-11-12 NOTE — Progress Notes (Signed)
Internal Medicine Clinic Attending  I saw and evaluated the patient.  I personally confirmed the key portions of the history and exam documented by Dr. Isabella Bowens and I reviewed pertinent patient test results.  The assessment, diagnosis, and plan were formulated together and I agree with the documentation in the resident's note.  [name] looks much improved today compared to his recent hospitalization. Plan for EGD soon, he is doing well with alcohol cessation which will be key for his future health.

## 2015-12-12 ENCOUNTER — Ambulatory Visit: Payer: Self-pay | Admitting: Internal Medicine

## 2015-12-26 ENCOUNTER — Ambulatory Visit: Payer: Self-pay | Admitting: Internal Medicine

## 2016-02-15 ENCOUNTER — Other Ambulatory Visit: Payer: Self-pay

## 2016-02-15 MED ORDER — FUROSEMIDE 40 MG PO TABS: 40.0000 mg | ORAL_TABLET | Freq: Every day | ORAL | Status: AC

## 2016-02-15 NOTE — Telephone Encounter (Signed)
Requesting furosemide and folic acid to be filled.

## 2016-02-15 NOTE — Telephone Encounter (Signed)
Have called pt scheduled appt 5/10, he assures me he will be here

## 2016-02-27 ENCOUNTER — Ambulatory Visit: Payer: Self-pay | Admitting: Internal Medicine

## 2016-03-22 ENCOUNTER — Other Ambulatory Visit: Payer: Self-pay | Admitting: Internal Medicine

## 2016-03-24 NOTE — Telephone Encounter (Signed)
Last appointment was 10/2015. With frequent no shows. Letter sent. No future appointments.

## 2016-03-24 NOTE — Telephone Encounter (Signed)
We have openings tomorrow. He can be eval and receive refill at that time.

## 2016-03-24 NOTE — Telephone Encounter (Signed)
LVM to call back.

## 2016-11-14 IMAGING — DX DG KNEE COMPLETE 4+V*R*
4 series · 4 of 4 positions shown · non-contrast
Comparison: None.

CLINICAL DATA: Bilateral knee pain

EXAM:
RIGHT KNEE - COMPLETE 4+ VIEW

[t knee ap right]
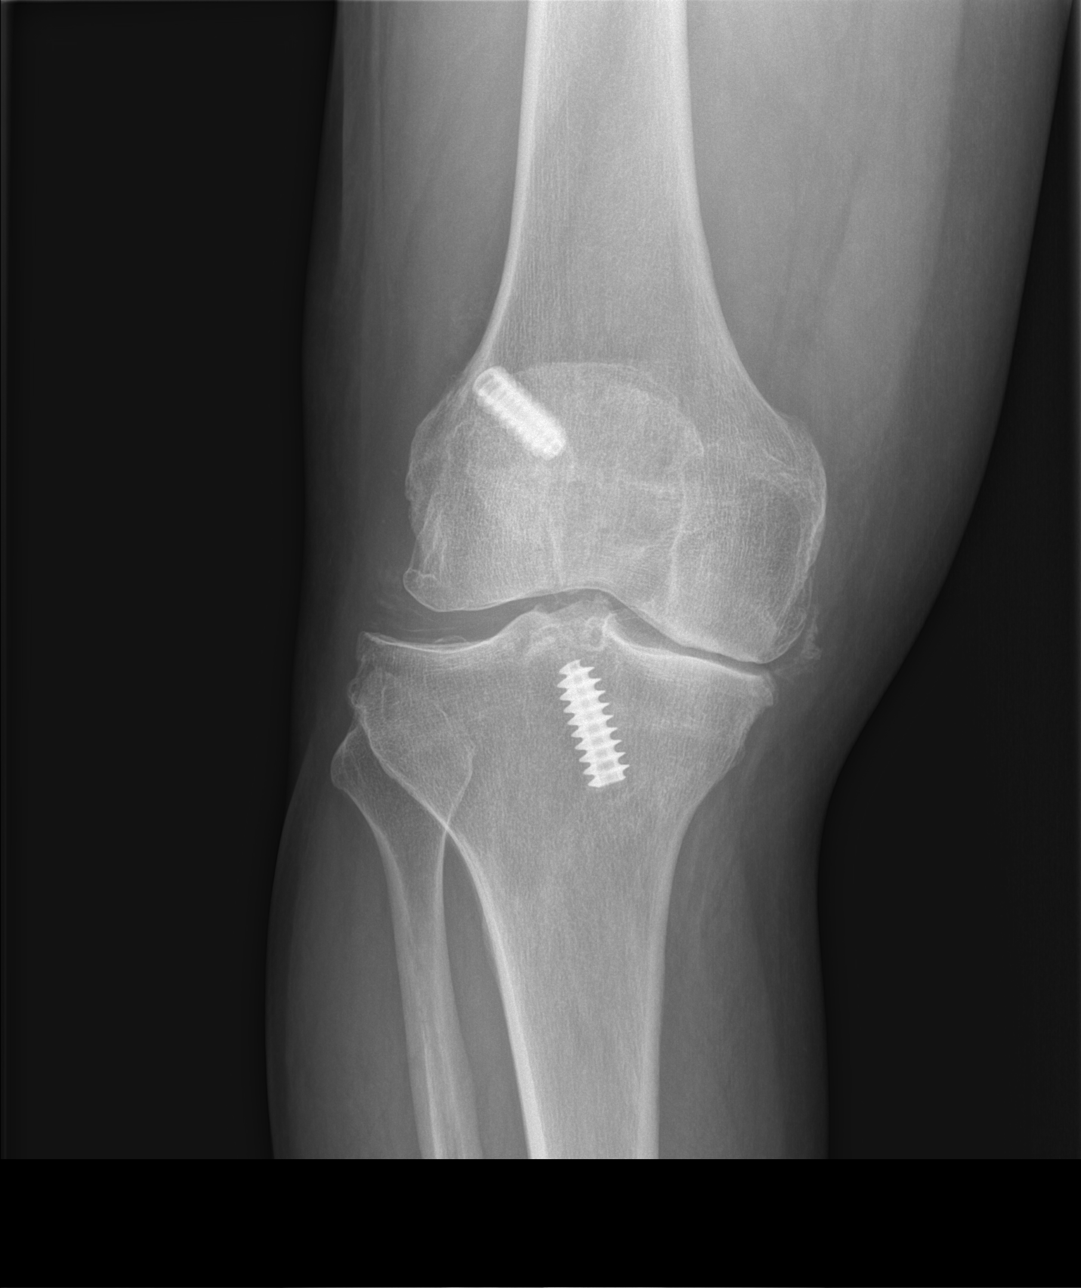

[t knee obl right]
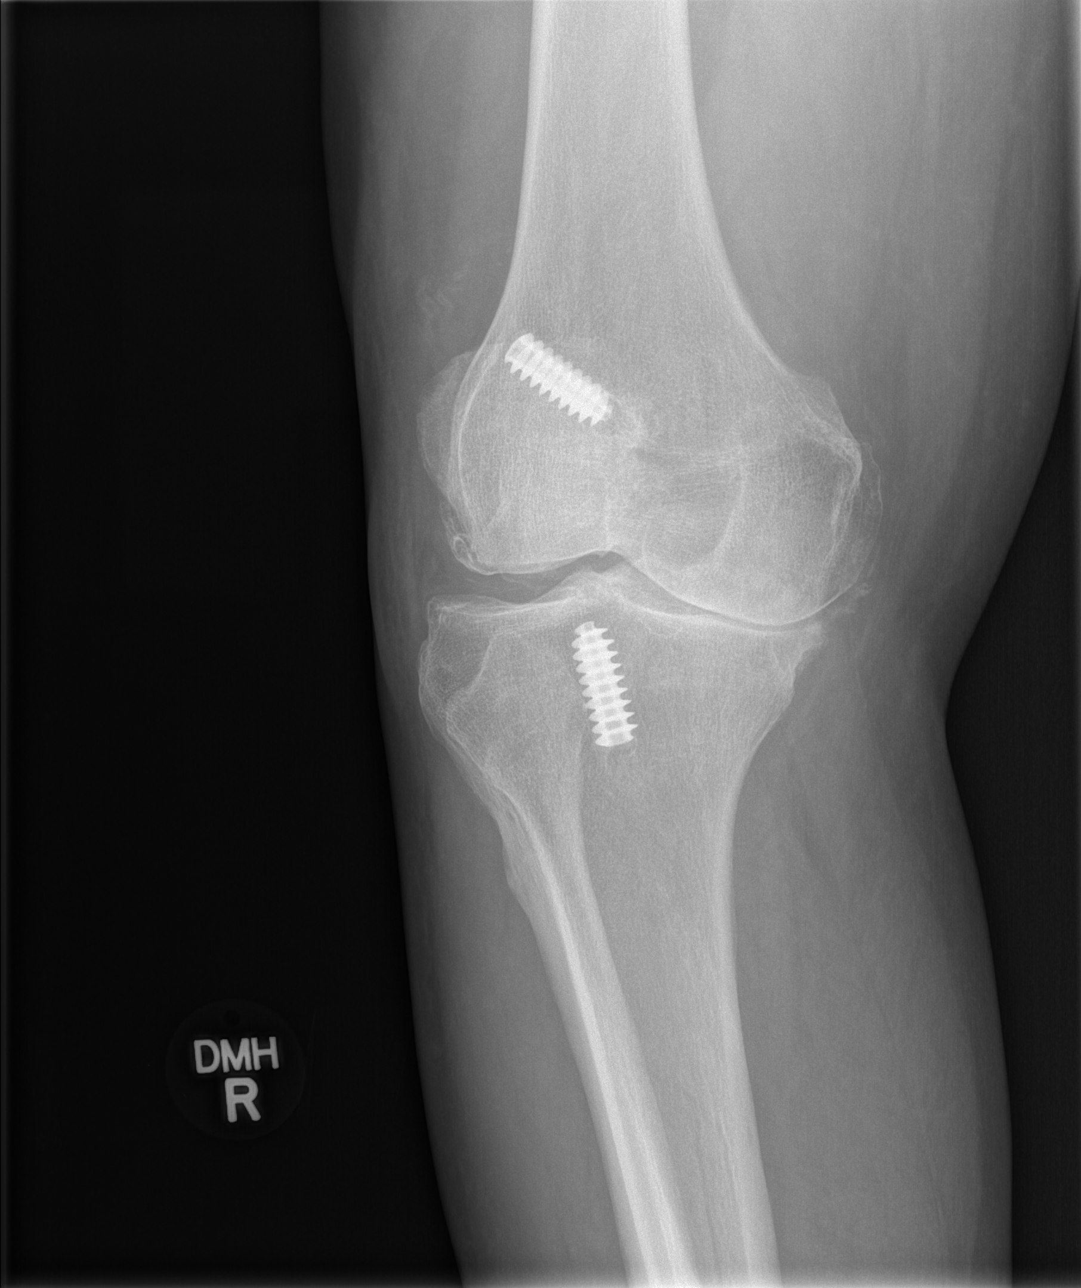

[t knee lat right (1 of 2)]
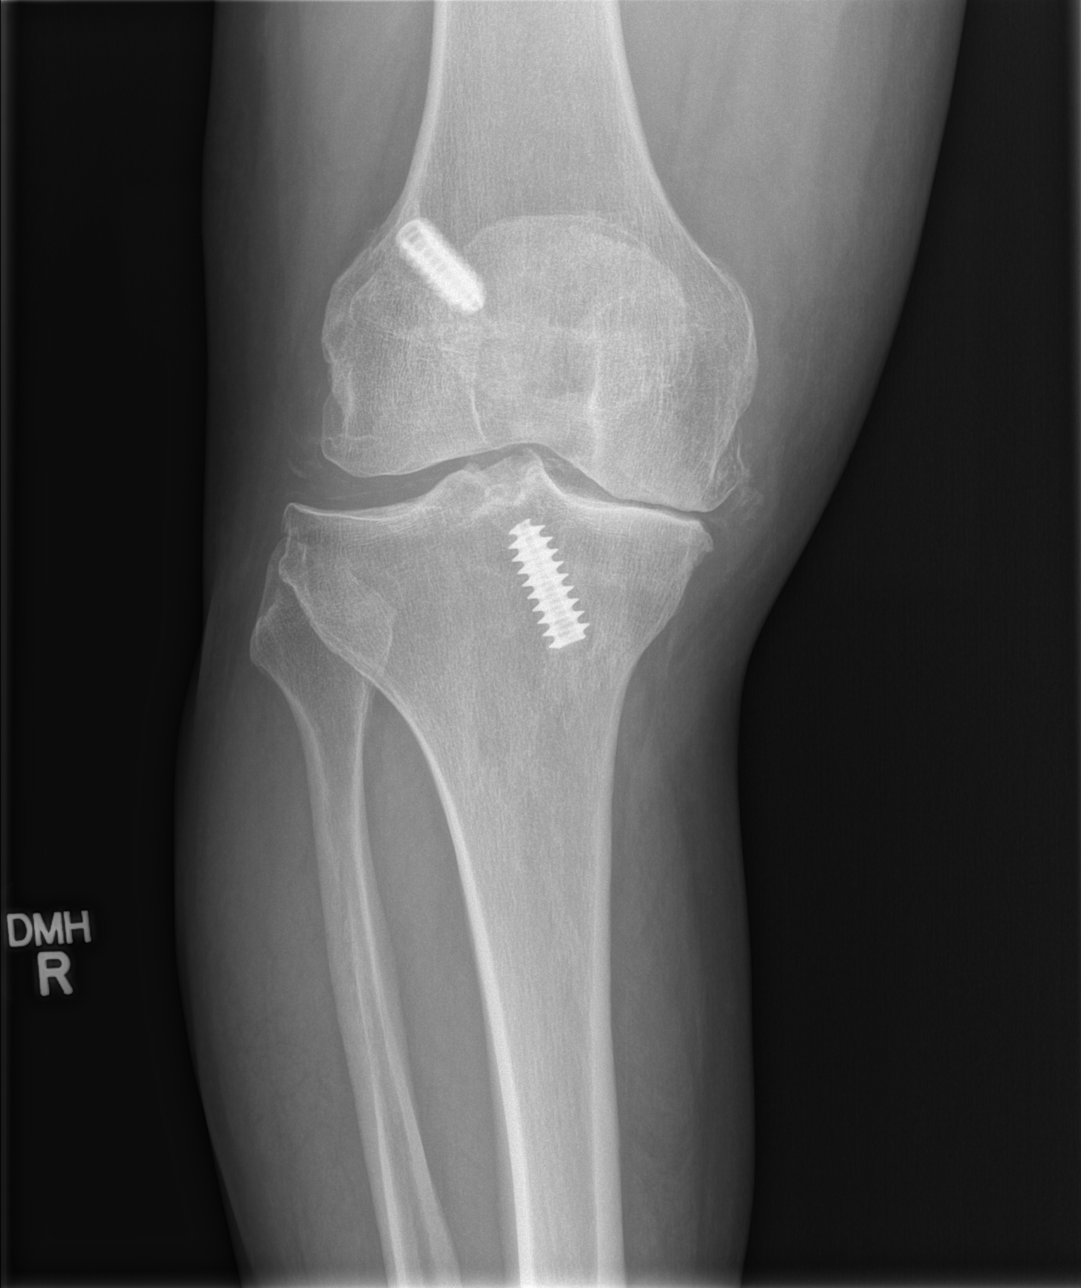

[t knee lat right (2 of 2)]
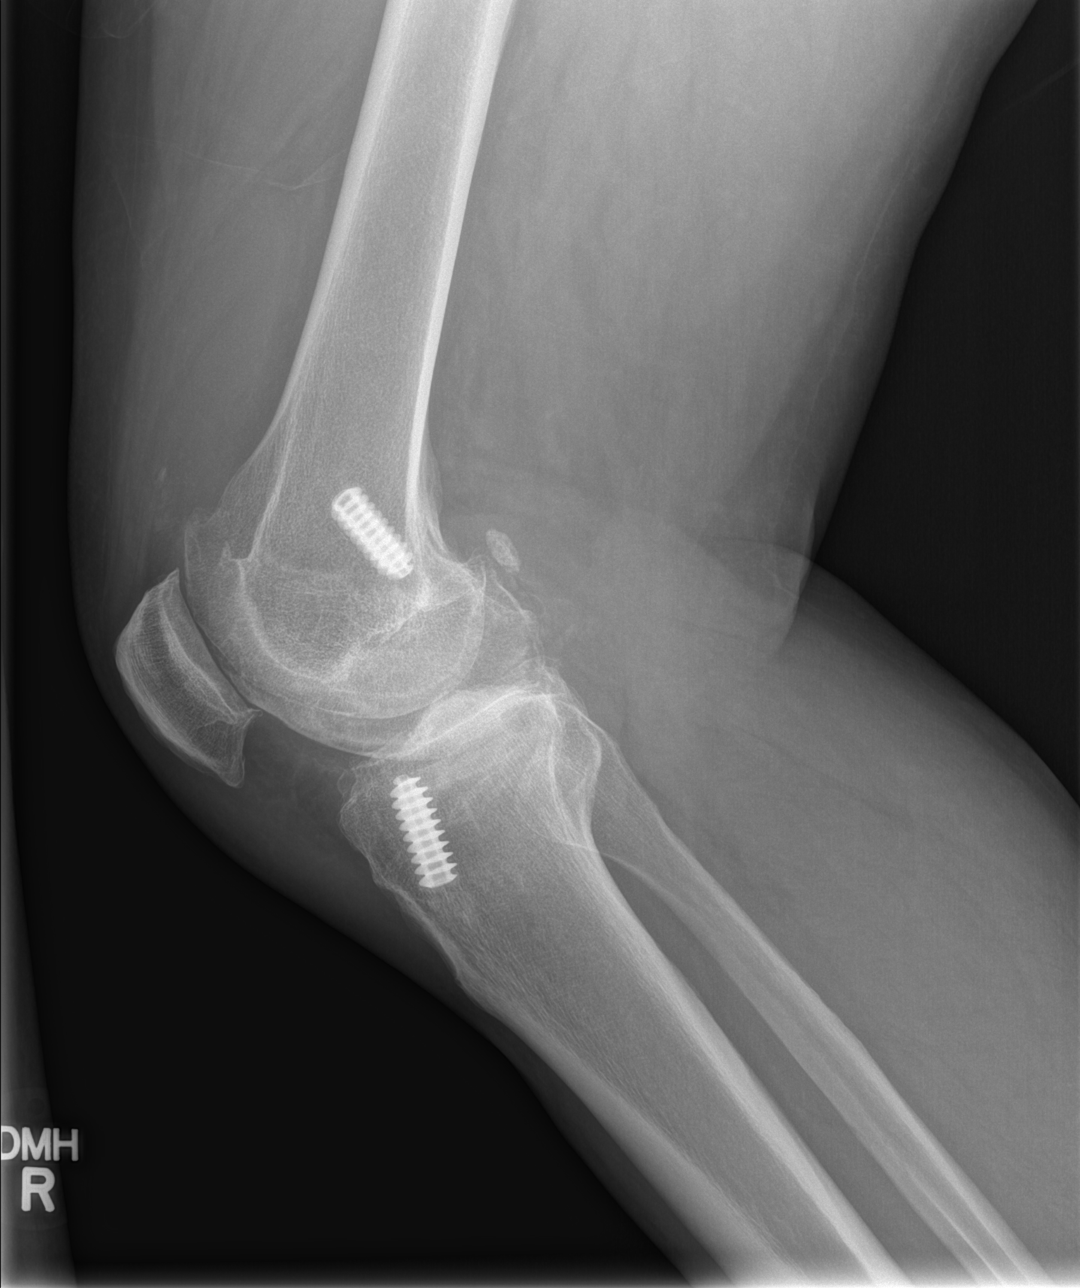

[4 of 4 positions shown; findings below may reference images not displayed]

FINDINGS: Four views of the right knee submitted. Postsurgical changes post
ACL repair are noted in distal femur and proximal tibia. There is
significant narrowing of medial joint compartment. Calcifications of
medial collateral ligament are noted. There is mild
chondrocalcinosis. Mild sclerosis of medial tibial plateau. Trace
joint effusion. Narrowing of patellofemoral joint space. Mild
spurring of patella. No acute fracture or subluxation.
IMPRESSION: No acute fracture or subluxation. Significant osteoarthritic changes
as described above. Postsurgical changes post ACL repair.

## 2020-05-20 DEATH — deceased
